# Patient Record
Sex: Female | Born: 1970 | Race: Black or African American | Hispanic: No | Smoking: Never smoker
Health system: Southern US, Community
[De-identification: ages and names within clinical notes are randomized; demographics above are authoritative.]

## PROBLEM LIST (undated history)

## (undated) ENCOUNTER — Inpatient Hospital Stay (HOSPITAL_COMMUNITY): Payer: Self-pay

## (undated) DIAGNOSIS — J45909 Unspecified asthma, uncomplicated: Secondary | ICD-10-CM

## (undated) DIAGNOSIS — B009 Herpesviral infection, unspecified: Secondary | ICD-10-CM

## (undated) DIAGNOSIS — O3410 Maternal care for benign tumor of corpus uteri, unspecified trimester: Secondary | ICD-10-CM

## (undated) DIAGNOSIS — E669 Obesity, unspecified: Secondary | ICD-10-CM

## (undated) DIAGNOSIS — Z973 Presence of spectacles and contact lenses: Secondary | ICD-10-CM

## (undated) DIAGNOSIS — Z8742 Personal history of other diseases of the female genital tract: Secondary | ICD-10-CM

## (undated) DIAGNOSIS — U071 COVID-19: Secondary | ICD-10-CM

## (undated) DIAGNOSIS — R519 Headache, unspecified: Secondary | ICD-10-CM

## (undated) DIAGNOSIS — R011 Cardiac murmur, unspecified: Secondary | ICD-10-CM

## (undated) DIAGNOSIS — N840 Polyp of corpus uteri: Secondary | ICD-10-CM

## (undated) DIAGNOSIS — D259 Leiomyoma of uterus, unspecified: Secondary | ICD-10-CM

## (undated) DIAGNOSIS — Z201 Contact with and (suspected) exposure to tuberculosis: Secondary | ICD-10-CM

## (undated) DIAGNOSIS — K219 Gastro-esophageal reflux disease without esophagitis: Secondary | ICD-10-CM

## (undated) DIAGNOSIS — Z87898 Personal history of other specified conditions: Secondary | ICD-10-CM

## (undated) DIAGNOSIS — E119 Type 2 diabetes mellitus without complications: Secondary | ICD-10-CM

## (undated) DIAGNOSIS — IMO0002 Reserved for concepts with insufficient information to code with codable children: Secondary | ICD-10-CM

## (undated) DIAGNOSIS — I10 Essential (primary) hypertension: Secondary | ICD-10-CM

## (undated) DIAGNOSIS — F329 Major depressive disorder, single episode, unspecified: Secondary | ICD-10-CM

## (undated) DIAGNOSIS — F419 Anxiety disorder, unspecified: Secondary | ICD-10-CM

## (undated) DIAGNOSIS — Z789 Other specified health status: Secondary | ICD-10-CM

## (undated) DIAGNOSIS — D509 Iron deficiency anemia, unspecified: Secondary | ICD-10-CM

## (undated) DIAGNOSIS — A159 Respiratory tuberculosis unspecified: Secondary | ICD-10-CM

## (undated) DIAGNOSIS — R7303 Prediabetes: Secondary | ICD-10-CM

## (undated) HISTORY — PX: REFRACTIVE SURGERY: SHX103

## (undated) HISTORY — DX: Type 2 diabetes mellitus without complications: E11.9

## (undated) HISTORY — DX: Essential (primary) hypertension: I10

## (undated) HISTORY — PX: COMBINED ABDOMINOPLASTY AND LIPOSUCTION: SUR284

## (undated) HISTORY — DX: Obesity, unspecified: E66.9

## (undated) HISTORY — PX: RETINAL DETACHMENT SURGERY: SHX105

---
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
## 1993-08-19 HISTORY — PX: CERVICAL BIOPSY  W/ LOOP ELECTRODE EXCISION: SUR135

## 1994-05-30 HISTORY — PX: COLPOCLEISIS VAGINAL LE FORT: SUR279

## 1994-11-18 HISTORY — PX: LEEP: SHX91

## 1995-08-20 HISTORY — PX: WISDOM TOOTH EXTRACTION: SHX21

## 1997-11-15 ENCOUNTER — Ambulatory Visit (HOSPITAL_COMMUNITY): Admission: RE | Admit: 1997-11-15 | Discharge: 1997-11-15 | Payer: Self-pay | Admitting: *Deleted

## 1998-06-09 ENCOUNTER — Encounter: Admission: RE | Admit: 1998-06-09 | Discharge: 1998-09-07 | Payer: Self-pay | Admitting: *Deleted

## 1998-06-09 ENCOUNTER — Inpatient Hospital Stay (HOSPITAL_COMMUNITY): Admission: AD | Admit: 1998-06-09 | Discharge: 1998-06-09 | Payer: Self-pay | Admitting: *Deleted

## 1998-06-10 ENCOUNTER — Inpatient Hospital Stay (HOSPITAL_COMMUNITY): Admission: AD | Admit: 1998-06-10 | Discharge: 1998-06-10 | Payer: Self-pay | Admitting: *Deleted

## 1998-06-22 ENCOUNTER — Inpatient Hospital Stay (HOSPITAL_COMMUNITY): Admission: AD | Admit: 1998-06-22 | Discharge: 1998-06-22 | Payer: Self-pay | Admitting: *Deleted

## 1998-06-23 ENCOUNTER — Encounter: Payer: Self-pay | Admitting: Obstetrics and Gynecology

## 1998-06-23 ENCOUNTER — Observation Stay (HOSPITAL_COMMUNITY): Admission: AD | Admit: 1998-06-23 | Discharge: 1998-06-24 | Payer: Self-pay | Admitting: Obstetrics and Gynecology

## 1998-06-24 ENCOUNTER — Encounter: Payer: Self-pay | Admitting: Obstetrics and Gynecology

## 1998-06-26 ENCOUNTER — Inpatient Hospital Stay (HOSPITAL_COMMUNITY): Admission: AD | Admit: 1998-06-26 | Discharge: 1998-06-28 | Payer: Self-pay | Admitting: Obstetrics and Gynecology

## 1998-08-09 ENCOUNTER — Inpatient Hospital Stay (HOSPITAL_COMMUNITY): Admission: AD | Admit: 1998-08-09 | Discharge: 1998-08-12 | Payer: Self-pay | Admitting: *Deleted

## 1998-08-14 ENCOUNTER — Encounter (HOSPITAL_COMMUNITY): Admission: RE | Admit: 1998-08-14 | Discharge: 1998-11-12 | Payer: Self-pay | Admitting: *Deleted

## 1999-05-05 ENCOUNTER — Other Ambulatory Visit: Admission: RE | Admit: 1999-05-05 | Discharge: 1999-05-05 | Payer: Self-pay | Admitting: *Deleted

## 1999-05-05 ENCOUNTER — Encounter (INDEPENDENT_AMBULATORY_CARE_PROVIDER_SITE_OTHER): Payer: Self-pay

## 1999-06-28 ENCOUNTER — Encounter (INDEPENDENT_AMBULATORY_CARE_PROVIDER_SITE_OTHER): Payer: Self-pay

## 1999-06-28 ENCOUNTER — Ambulatory Visit (HOSPITAL_COMMUNITY): Admission: RE | Admit: 1999-06-28 | Discharge: 1999-06-28 | Payer: Self-pay | Admitting: *Deleted

## 1999-06-28 HISTORY — PX: HYSTEROSCOPY: SHX211

## 1999-06-28 HISTORY — PX: DILATION AND CURETTAGE OF UTERUS: SHX78

## 1999-06-28 HISTORY — PX: COMBINED HYSTEROSCOPY DIAGNOSTIC / D&C: SUR297

## 2002-01-14 ENCOUNTER — Other Ambulatory Visit: Admission: RE | Admit: 2002-01-14 | Discharge: 2002-01-14 | Payer: Self-pay | Admitting: Obstetrics & Gynecology

## 2003-01-18 ENCOUNTER — Other Ambulatory Visit: Admission: RE | Admit: 2003-01-18 | Discharge: 2003-01-18 | Payer: Self-pay | Admitting: Obstetrics & Gynecology

## 2004-01-25 ENCOUNTER — Other Ambulatory Visit: Admission: RE | Admit: 2004-01-25 | Discharge: 2004-01-25 | Payer: Self-pay | Admitting: Obstetrics & Gynecology

## 2006-01-04 ENCOUNTER — Emergency Department (HOSPITAL_COMMUNITY): Admission: EM | Admit: 2006-01-04 | Discharge: 2006-01-04 | Payer: Self-pay | Admitting: Emergency Medicine

## 2006-02-07 ENCOUNTER — Encounter: Admission: RE | Admit: 2006-02-07 | Discharge: 2006-02-07 | Payer: Self-pay | Admitting: Internal Medicine

## 2007-08-27 ENCOUNTER — Encounter: Admission: RE | Admit: 2007-08-27 | Discharge: 2007-08-27 | Payer: Self-pay | Admitting: Internal Medicine

## 2007-11-23 ENCOUNTER — Ambulatory Visit: Payer: Self-pay | Admitting: Internal Medicine

## 2009-01-13 ENCOUNTER — Inpatient Hospital Stay (HOSPITAL_COMMUNITY): Admission: AD | Admit: 2009-01-13 | Discharge: 2009-01-13 | Payer: Self-pay | Admitting: Obstetrics & Gynecology

## 2009-01-13 ENCOUNTER — Ambulatory Visit: Payer: Self-pay | Admitting: Obstetrics and Gynecology

## 2009-03-20 ENCOUNTER — Inpatient Hospital Stay (HOSPITAL_COMMUNITY): Admission: AD | Admit: 2009-03-20 | Discharge: 2009-03-20 | Payer: Self-pay | Admitting: Obstetrics & Gynecology

## 2009-03-20 ENCOUNTER — Ambulatory Visit: Payer: Self-pay | Admitting: Advanced Practice Midwife

## 2009-03-21 ENCOUNTER — Encounter (INDEPENDENT_AMBULATORY_CARE_PROVIDER_SITE_OTHER): Payer: Self-pay | Admitting: Obstetrics and Gynecology

## 2009-03-21 ENCOUNTER — Inpatient Hospital Stay (HOSPITAL_COMMUNITY): Admission: AD | Admit: 2009-03-21 | Discharge: 2009-03-23 | Payer: Self-pay | Admitting: Obstetrics & Gynecology

## 2009-03-24 ENCOUNTER — Encounter: Admission: RE | Admit: 2009-03-24 | Discharge: 2009-04-23 | Payer: Self-pay | Admitting: Obstetrics & Gynecology

## 2009-04-24 ENCOUNTER — Encounter: Admission: RE | Admit: 2009-04-24 | Discharge: 2009-05-23 | Payer: Self-pay | Admitting: Obstetrics & Gynecology

## 2009-05-24 ENCOUNTER — Encounter: Admission: RE | Admit: 2009-05-24 | Discharge: 2009-06-23 | Payer: Self-pay | Admitting: Obstetrics & Gynecology

## 2009-06-24 ENCOUNTER — Encounter: Admission: RE | Admit: 2009-06-24 | Discharge: 2009-07-23 | Payer: Self-pay | Admitting: Obstetrics & Gynecology

## 2009-07-24 ENCOUNTER — Encounter: Admission: RE | Admit: 2009-07-24 | Discharge: 2009-08-17 | Payer: Self-pay | Admitting: Obstetrics & Gynecology

## 2009-08-24 ENCOUNTER — Encounter: Admission: RE | Admit: 2009-08-24 | Discharge: 2009-09-23 | Payer: Self-pay | Admitting: Obstetrics & Gynecology

## 2009-09-24 ENCOUNTER — Encounter: Admission: RE | Admit: 2009-09-24 | Discharge: 2009-10-06 | Payer: Self-pay | Admitting: Obstetrics & Gynecology

## 2010-01-31 IMAGING — US US FETAL BPP W/O NONSTRESS
1 series · 14 of 16 positions shown · non-contrast
Comparison: none

OBSTETRICAL ULTRASOUND:
 This ultrasound exam was performed in the [HOSPITAL] Ultrasound Department.  The OB US report was generated in the AS system, and faxed to the ordering physician.  This report is also available in [REDACTED] PACS.

[Series 1: us fetal bpp w/o nonstress · non-contrast · 0.29mm/px · 16 acquisitions, 14 frames shown]
[im 1/16]
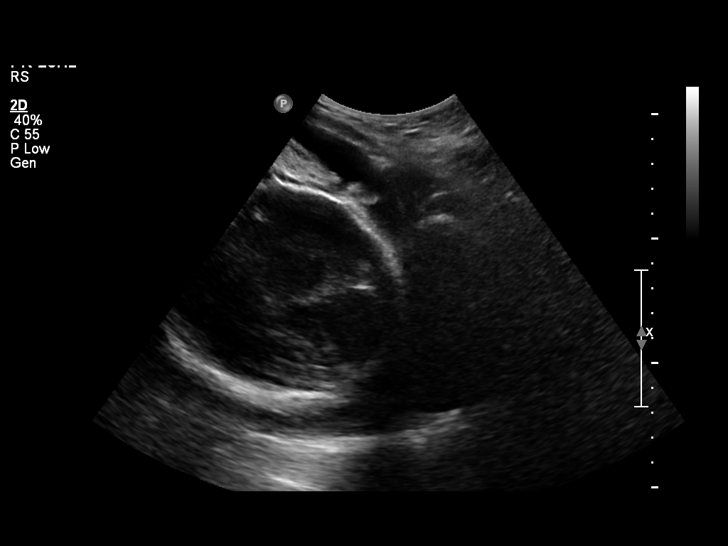
[im 2/16]
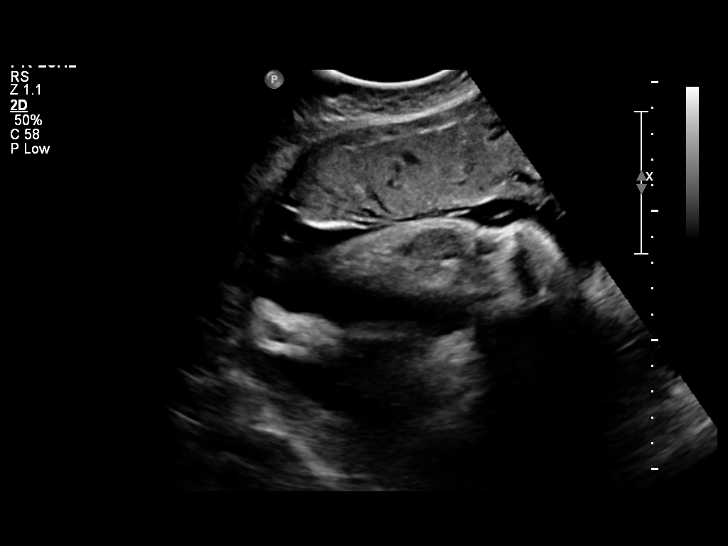
[im 3/16]
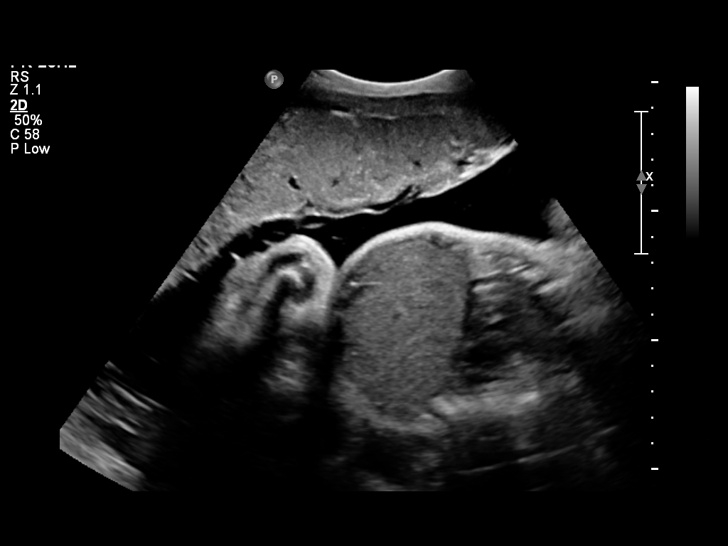
[im 5/16]
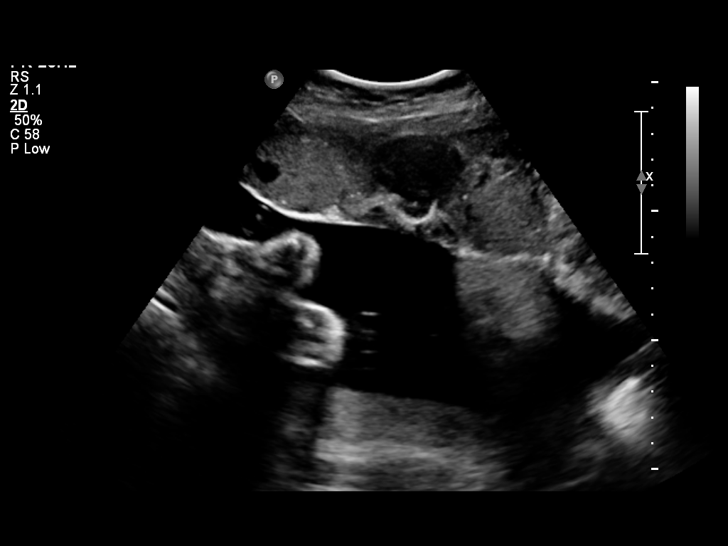
[im 6/16]
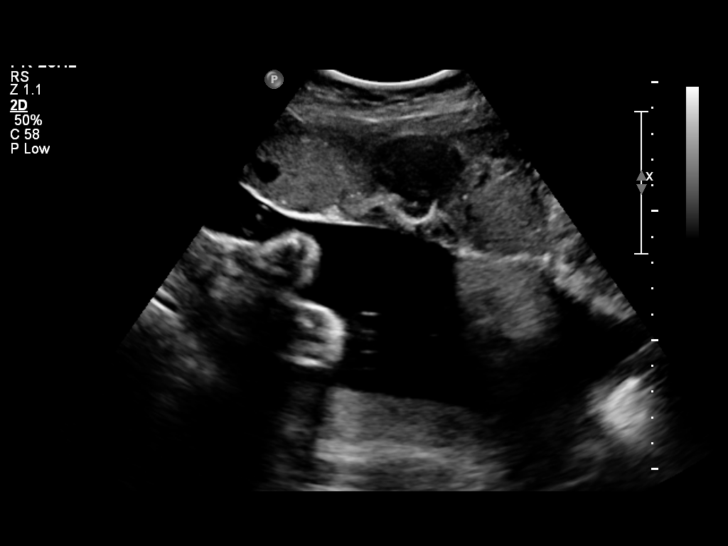
[im 7/16]
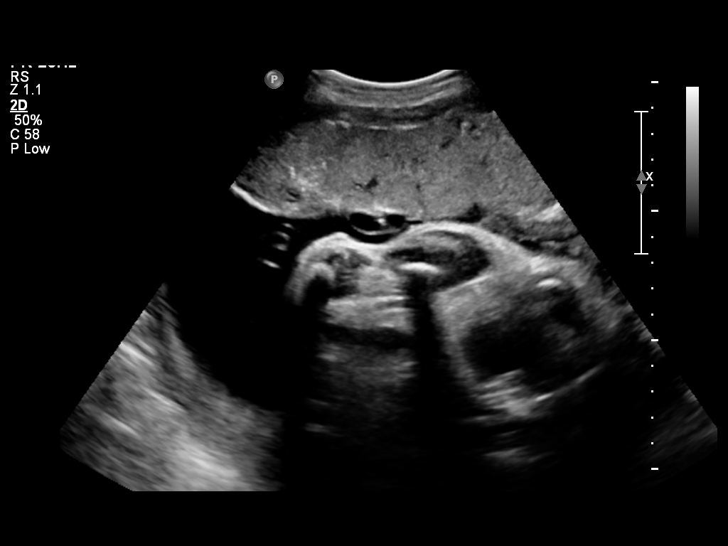
[im 8/16]
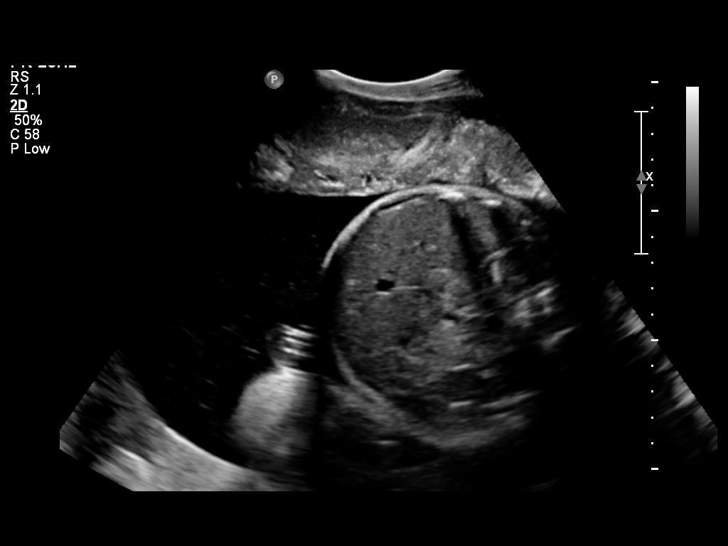
[im 9/16]
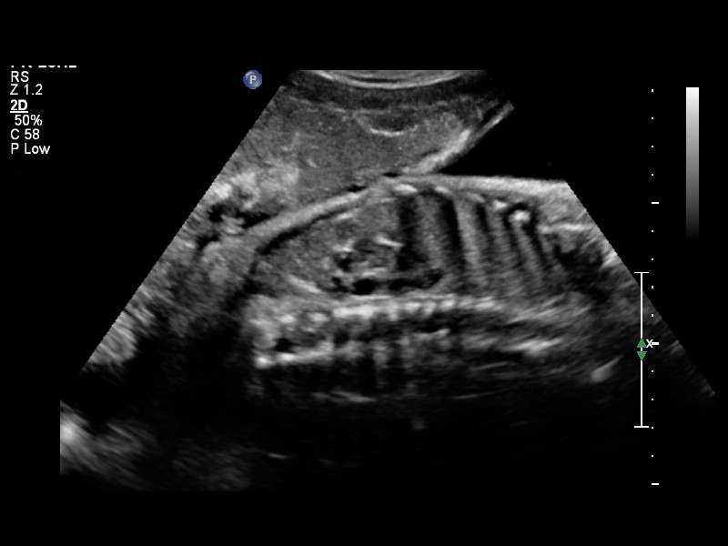
[im 10/16]
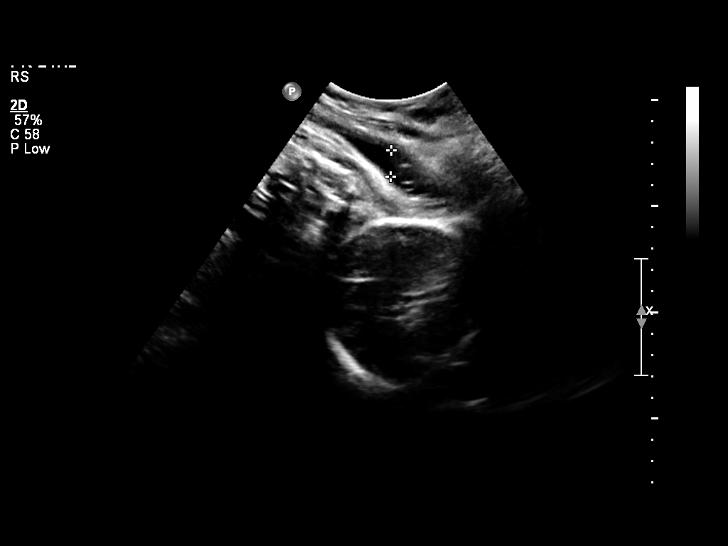
[im 11/16]
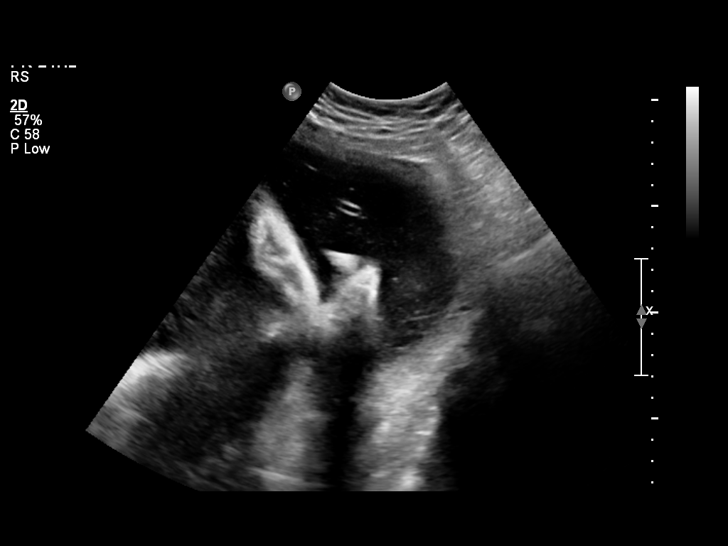
[im 13/16]
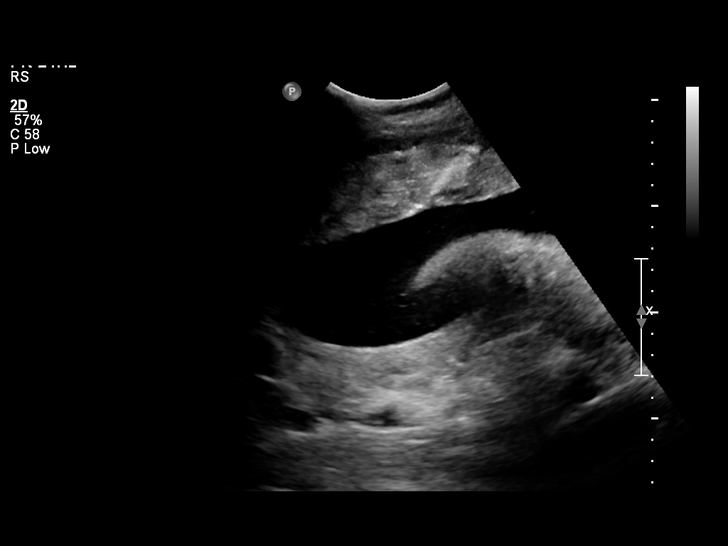
[im 14/16]
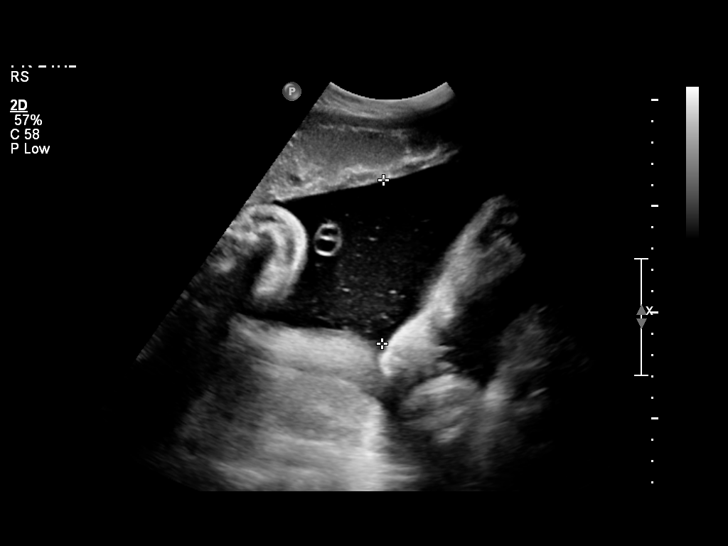
[im 15/16]
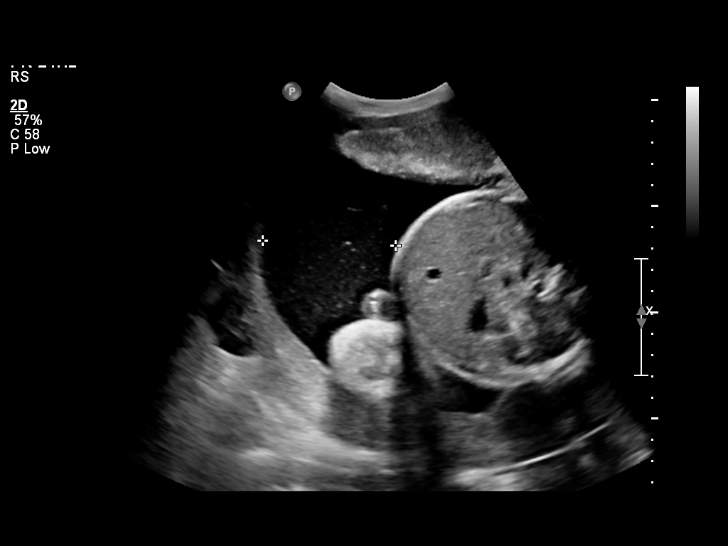
[im 16/16]
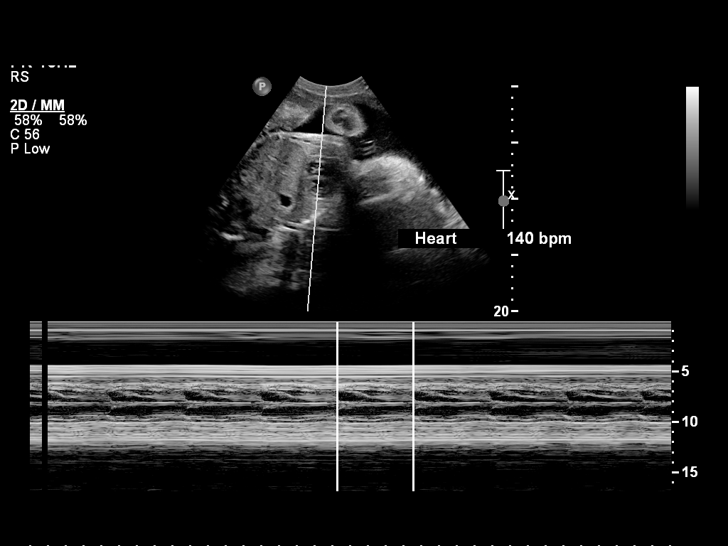

[14 of 16 positions shown; findings below may reference images not displayed]

IMPRESSION: See AS Obstetric US report.

## 2010-11-24 LAB — COMPREHENSIVE METABOLIC PANEL
AST: 33 U/L (ref 0–37)
Albumin: 2.7 g/dL — ABNORMAL LOW (ref 3.5–5.2)
Alkaline Phosphatase: 232 U/L — ABNORMAL HIGH (ref 39–117)
Alkaline Phosphatase: 242 U/L — ABNORMAL HIGH (ref 39–117)
BUN: 4 mg/dL — ABNORMAL LOW (ref 6–23)
BUN: 4 mg/dL — ABNORMAL LOW (ref 6–23)
CO2: 21 mEq/L (ref 19–32)
Chloride: 106 mEq/L (ref 96–112)
Chloride: 107 mEq/L (ref 96–112)
Creatinine, Ser: 0.38 mg/dL — ABNORMAL LOW (ref 0.4–1.2)
Creatinine, Ser: 0.5 mg/dL (ref 0.4–1.2)
GFR calc Af Amer: >60 mL/min (ref >60)
GFR calc non Af Amer: >60 mL/min (ref >60)
Glucose, Bld: 81 mg/dL (ref 70–99)
Potassium: 3.8 mEq/L (ref 3.5–5.1)
Potassium: 3.9 mEq/L (ref 3.5–5.1)
Total Bilirubin: 0.3 mg/dL (ref 0.3–1.2)
Total Bilirubin: 0.4 mg/dL (ref 0.3–1.2)
Total Protein: 5.9 g/dL — ABNORMAL LOW (ref 6.0–8.3)

## 2010-11-24 LAB — CBC
HCT: 33.7 % — ABNORMAL LOW (ref 36.0–46.0)
HCT: 39 % (ref 36.0–46.0)
Hemoglobin: 11.1 g/dL — ABNORMAL LOW (ref 12.0–15.0)
Hemoglobin: 12.9 g/dL (ref 12.0–15.0)
MCV: 87 fL (ref 78.0–100.0)
MCV: 89.2 fL (ref 78.0–100.0)
Platelets: 128 10*3/uL — ABNORMAL LOW (ref 150–400)
Platelets: 151 10*3/uL (ref 150–400)
RBC: 4.49 MIL/uL (ref 3.87–5.11)
RBC: 4.77 MIL/uL (ref 3.87–5.11)
WBC: 11.5 10*3/uL — ABNORMAL HIGH (ref 4.0–10.5)
WBC: 11.8 10*3/uL — ABNORMAL HIGH (ref 4.0–10.5)
WBC: 9.4 10*3/uL (ref 4.0–10.5)

## 2010-11-24 LAB — URIC ACID: Uric Acid, Serum: 4.6 mg/dL (ref 2.4–7.0)

## 2010-11-24 LAB — URINALYSIS, ROUTINE W REFLEX MICROSCOPIC
Glucose, UA: NEGATIVE mg/dL
Hgb urine dipstick: NEGATIVE
Protein, ur: NEGATIVE mg/dL

## 2010-11-24 LAB — RPR: RPR Ser Ql: NONREACTIVE

## 2011-01-01 NOTE — Assessment & Plan Note (Signed)
[facility]                         GASTROENTEROLOGY OFFICE NOTE   Alexandra Hale, Alexandra Hale                        MRN:          [id number]  DATE:11/23/2007                            DOB:          1970/12/02    Alexandra Hale is a very nice 40 year old patient of Dr. Seymour Bars who is  referred here because of abdominal pain. Dr. Seymour Bars has sent [place] her  office note from September 15, 2007, describing the patient complaining of  abdominal pain which started in the left flank, radiated along the left  upper quadrant, throbbing sometimes and achy and tingling in sensation.  It was associated with constipation. Dr. Seymour Bars felt that she needs to  be further evaluated first for constipation and if the pain persists in  spite of treatment of the constipation she would proceed with diagnostic  laparoscopy to rule out possible endometriosis. Alexandra Hale has waited  for appointment to see me today and in the meantime all her symptoms  have subsided. She no longer has any constipation, and she no longer has  any abdominal pain. She has been well now for several weeks. The  constipation improved after she started drinking more liquids and taking  more fiber in her diet. There has been no fever, rectal bleeding. She  has a positive family history of diverticulosis and colon polyps in her  mother.   MEDICATIONS:  Tri-Cyclen 0.025 mg every day.   PAST MEDICAL HISTORY:  Gestational diabetes in 1999.   FAMILY HISTORY:  Positive for colon polyps and heart disease in mother.  Diabetes in father.   SOCIAL HISTORY:  Married with one child. 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 She does not smoke and does not drink alcohol.   REVIEW OF SYSTEMS:  Positive for eyeglasses, night sweats, pain with  periods.   PHYSICAL EXAMINATION:  VITAL SIGNS:  Blood pressure 122/70, pulse 68,  and weight 171 pounds.  GENERAL:  She was alert, oriented, in no distress.  SKIN:  Warm and  dry.  HEENT:  Sclerae anicteric.  NECK:  Supple. No lymphadenopathy.  LUNGS:  Clear to auscultation.  CHEST:  Exam of the rib cage did not detect any point of tenderness;  specifically no tenderness in the costochondral junctions.  ABDOMEN:  Soft, nondistended with normoactive bowel sounds. I could not  elicit any tenderness in the left or right upper quadrant or any of the  lower quadrants. Left __________  was unremarkable. No fullness. No CVA  tenderness.  RECTAL:  Normal perianal  tone was normal. There was a small amount of  Hemoccult negative stool in the __________ .   IMPRESSION:  A 40 year old African American female with episode of  abdominal pain and constipation which has resolved. She has no history  of chronic constipation or abdominal pain. This was an isolated episode  which lasted about 4 weeks. She is Hemoccult negative, and there are no  risk factors to prompt diagnostic colonoscopy at this time. At age of  37, I would suggest for her to have a colonoscopic exam or before if  her  symptoms recur.   PLAN:  1. I have given patient a well high-fiber diet as well as booklet on      constipation.  2. Colonoscopy at the age of 39.  3. We will see her again on a p.r.n. basis.     Alexandra Hale. Juanda Chance, MD  Electronically Signed    DMB/MedQ  DD: 11/23/2007  DT: 11/23/2007  Job #: [id number]   cc:   Genia Del, M.D.

## 2011-01-04 NOTE — Op Note (Signed)
[facility]  Patient:    Alexandra Hale                     MRN: [id number] Proc. Date: 06/28/99 Adm. Date:  [id number] Attending:  Ardeen Fillers                           Operative Report  PREOPERATIVE DIAGNOSIS:       Abnormal uterine bleeding, endometrial polyp on office biopsy.  POSTOPERATIVE DIAGNOSIS:      Abnormal uterine bleeding, endometrial polyp on office biopsy.  OPERATION:                    Operative hysteroscopy, D&C.  SURGEON:                      Sung Amabile. Roslyn Smiling, M.D.  ASSISTANT:  ANESTHESIA:                   General anesthesia with LMA.  ESTIMATED BLOOD LOSS:         50 cc.  TUBES AND DRAINS:             None.  COMPLICATIONS:                None.  FINDINGS:                     Anteverted normal size uterus, sounded to 9 cm. Polypoid appearing area in the endometrial cavity, resected.  No adnexal masses. Endometrial cavity otherwise normal.  SPECIMENS:                    Endometrial biopsies and curettings to pathology.   INDICATIONS:                  A 40 year old woman, gravida 1, para 1, receiving  oral contraceptives, with irregular bleeding.  Endometrial biopsy in the office  revealed a fragment of endometrial polyp and the patient is admitted now for operative hysteroscopy.  DESCRIPTION OF PROCEDURE:     After the establishment of general anesthesia, the patient was placed in the dorsal lithotomy position.  The perineum and vagina were prepped with Betadine solution.  The bladder was evacuated with straight catheterization.  The patient was draped.  Examination under anesthesia for the  above findings was carried out.  Graves speculum was inserted in the vagina.  The cervix was reprepped with Betadine solution.  The anterior cervical lip was grasped with a single tooth tenaculum.  The uterus was sounded to 9 cm.  Pratt dilators were used to serially dilate the endocervical canal to a #33 [place].   Operative hysteroscope was introduced into the endometrial cavity without difficulty.  Sorbitol was the distending medium at a  pressure of 80 mmHg.  Photographs were taken.  The polypoid area was resected with a 90 degree single loupe on settings of 70 cutting and 70 coagulation.  The biopsies were removed.  Gentle sharp curettage was performed.  Instruments were  removed and hemostasis was noted.  The patient was returned to the supine position, extubated without difficulty, and transported to the recovery room in satisfactory condition. DD:  06/28/99 TD:  06/28/99 Job: [id number]

## 2011-05-31 LAB — OB RESULTS CONSOLE ANTIBODY SCREEN: Antibody Screen: NEGATIVE

## 2011-05-31 LAB — OB RESULTS CONSOLE HEPATITIS B SURFACE ANTIGEN: Hepatitis B Surface Ag: NEGATIVE

## 2011-06-18 LAB — OB RESULTS CONSOLE GC/CHLAMYDIA
Chlamydia: NEGATIVE
Gonorrhea: NEGATIVE
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
## 2011-09-27 ENCOUNTER — Encounter (HOSPITAL_COMMUNITY): Payer: Self-pay | Admitting: *Deleted

## 2011-09-27 ENCOUNTER — Inpatient Hospital Stay (HOSPITAL_COMMUNITY)
Admission: AD | Admit: 2011-09-27 | Discharge: 2011-09-27 | Disposition: A | Discharge status: Home / Self Care | Payer: BC Managed Care – PPO | Source: Ambulatory Visit | Attending: Obstetrics & Gynecology | Admitting: Obstetrics & Gynecology

## 2011-09-27 DIAGNOSIS — Y92410 Unspecified street and highway as the place of occurrence of the external cause: Secondary | ICD-10-CM | POA: Insufficient documentation

## 2011-09-27 DIAGNOSIS — O99891 Other specified diseases and conditions complicating pregnancy: Secondary | ICD-10-CM | POA: Insufficient documentation

## 2011-09-27 HISTORY — DX: Reserved for concepts with insufficient information to code with codable children: IMO0002
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
## 2011-12-04 LAB — OB RESULTS CONSOLE GBS: GBS: POSITIVE

## 2012-01-04 ENCOUNTER — Encounter (HOSPITAL_COMMUNITY): Payer: Self-pay | Admitting: Anesthesiology

## 2012-01-04 ENCOUNTER — Inpatient Hospital Stay (HOSPITAL_COMMUNITY): Payer: BC Managed Care – PPO | Admitting: Anesthesiology

## 2012-01-04 ENCOUNTER — Inpatient Hospital Stay (HOSPITAL_COMMUNITY)
Admission: AD | Admit: 2012-01-04 | Discharge: 2012-01-06 | DRG: 373 | Disposition: A | Discharge status: Home / Self Care | Payer: BC Managed Care – PPO | Source: Ambulatory Visit | Attending: Obstetrics | Admitting: Obstetrics

## 2012-01-04 ENCOUNTER — Encounter (HOSPITAL_COMMUNITY): Payer: Self-pay | Admitting: *Deleted

## 2012-01-04 DIAGNOSIS — O99892 Other specified diseases and conditions complicating childbirth: Secondary | ICD-10-CM | POA: Diagnosis present

## 2012-01-04 DIAGNOSIS — O09529 Supervision of elderly multigravida, unspecified trimester: Secondary | ICD-10-CM | POA: Diagnosis present

## 2012-01-04 DIAGNOSIS — Z23 Encounter for immunization: Secondary | ICD-10-CM

## 2012-01-04 DIAGNOSIS — IMO0001 Reserved for inherently not codable concepts without codable children: Secondary | ICD-10-CM

## 2012-01-04 DIAGNOSIS — Z22330 Carrier of Group B streptococcus: Secondary | ICD-10-CM

## 2012-01-04 HISTORY — DX: Leiomyoma of uterus, unspecified: D25.9

## 2012-01-04 HISTORY — DX: Herpesviral infection, unspecified: B00.9

## 2012-01-04 HISTORY — DX: Maternal care for benign tumor of corpus uteri, unspecified trimester: O34.10

## 2012-01-04 HISTORY — DX: Respiratory tuberculosis unspecified: A15.9

## 2012-01-04 HISTORY — DX: Other specified health status: Z78.9

## 2012-01-04 LAB — CBC
Hemoglobin: 11.9 g/dL — ABNORMAL LOW (ref 12.0–15.0)
MCH: 29 pg (ref 26.0–34.0)
MCV: 86.4 fL (ref 78.0–100.0)
RBC: 4.11 MIL/uL (ref 3.87–5.11)
WBC: 10 10*3/uL (ref 4.0–10.5)

## 2012-01-04 MED ORDER — FENTANYL 2.5 MCG/ML BUPIVACAINE 1/10 % EPIDURAL INFUSION (WH - ANES)
INTRAMUSCULAR | Status: DC | PRN
  Administered 2012-01-04: 14 mL/h via EPIDURAL

## 2012-01-04 MED ORDER — OXYTOCIN BOLUS FROM INFUSION
500.0000 mL | Freq: Once | INTRAVENOUS | Status: DC
  Filled 2012-01-04: qty 500
  Filled 2012-01-04: qty 1000

## 2012-01-04 MED ORDER — ZOLPIDEM TARTRATE 5 MG PO TABS: 5.0000 mg | ORAL_TABLET | Freq: Every evening | ORAL | Status: DC | PRN

## 2012-01-04 MED ORDER — FLEET ENEMA 7-19 GM/118ML RE ENEM: 1.0000 | ENEMA | RECTAL | Status: DC | PRN

## 2012-01-04 MED ORDER — IBUPROFEN 600 MG PO TABS
600.0000 mg | ORAL_TABLET | Freq: Four times a day (QID) | ORAL | Status: DC
  Administered 2012-01-04 – 2012-01-06 (×7): 600 mg via ORAL
  Filled 2012-01-04 (×7): qty 1

## 2012-01-04 MED ORDER — LACTATED RINGERS IV SOLN
500.0000 mL | INTRAVENOUS | Status: DC | PRN
  Administered 2012-01-04: 1000 mL via INTRAVENOUS

## 2012-01-04 MED ORDER — PHENYLEPHRINE 40 MCG/ML (10ML) SYRINGE FOR IV PUSH (FOR BLOOD PRESSURE SUPPORT): 80.0000 ug | PREFILLED_SYRINGE | INTRAVENOUS | Status: DC | PRN

## 2012-01-04 MED ORDER — OXYTOCIN 20 UNITS IN LACTATED RINGERS INFUSION - SIMPLE
125.0000 mL/h | Freq: Once | INTRAVENOUS | Status: AC
  Administered 2012-01-04: 999 mL/h via INTRAVENOUS

## 2012-01-04 MED ORDER — BUTORPHANOL TARTRATE 2 MG/ML IJ SOLN: 1.0000 mg | INTRAMUSCULAR | Status: DC | PRN

## 2012-01-04 MED ORDER — IBUPROFEN 600 MG PO TABS: 600.0000 mg | ORAL_TABLET | Freq: Four times a day (QID) | ORAL | Status: DC | PRN

## 2012-01-04 MED ORDER — FLEET ENEMA 7-19 GM/118ML RE ENEM: 1.0000 | ENEMA | Freq: Every day | RECTAL | Status: DC | PRN

## 2012-01-04 MED ORDER — OXYCODONE-ACETAMINOPHEN 5-325 MG PO TABS: 1.0000 | ORAL_TABLET | ORAL | Status: DC | PRN

## 2012-01-04 MED ORDER — LACTATED RINGERS IV SOLN: 500.0000 mL | Freq: Once | INTRAVENOUS | Status: DC

## 2012-01-04 MED ORDER — DIPHENHYDRAMINE HCL 50 MG/ML IJ SOLN: 12.5000 mg | INTRAMUSCULAR | Status: DC | PRN

## 2012-01-04 MED ORDER — SODIUM BICARBONATE 8.4 % IV SOLN
INTRAVENOUS | Status: DC | PRN
  Administered 2012-01-04: 4 mL via EPIDURAL

## 2012-01-04 MED ORDER — PHENYLEPHRINE 40 MCG/ML (10ML) SYRINGE FOR IV PUSH (FOR BLOOD PRESSURE SUPPORT)
80.0000 ug | PREFILLED_SYRINGE | INTRAVENOUS | Status: DC | PRN
  Filled 2012-01-04: qty 5

## 2012-01-04 MED ORDER — SENNOSIDES-DOCUSATE SODIUM 8.6-50 MG PO TABS: 2.0000 | ORAL_TABLET | Freq: Every day | ORAL | Status: DC

## 2012-01-04 MED ORDER — WITCH HAZEL-GLYCERIN EX PADS: 1.0000 "application " | MEDICATED_PAD | CUTANEOUS | Status: DC | PRN

## 2012-01-04 MED ORDER — LACTATED RINGERS IV SOLN
INTRAVENOUS | Status: DC
  Administered 2012-01-04: 19:00:00 via INTRAVENOUS

## 2012-01-04 MED ORDER — EPHEDRINE 5 MG/ML INJ
10.0000 mg | INTRAVENOUS | Status: DC | PRN
  Filled 2012-01-04: qty 4

## 2012-01-04 MED ORDER — FENTANYL 2.5 MCG/ML BUPIVACAINE 1/10 % EPIDURAL INFUSION (WH - ANES)
14.0000 mL/h | INTRAMUSCULAR | Status: DC
  Filled 2012-01-04: qty 60

## 2012-01-04 MED ORDER — ACETAMINOPHEN 325 MG PO TABS: 650.0000 mg | ORAL_TABLET | ORAL | Status: DC | PRN

## 2012-01-04 MED ORDER — LIDOCAINE HCL (PF) 1 % IJ SOLN
30.0000 mL | INTRAMUSCULAR | Status: DC | PRN
  Filled 2012-01-04: qty 30

## 2012-01-04 MED ORDER — PRENATAL MULTIVITAMIN CH
1.0000 | ORAL_TABLET | Freq: Every day | ORAL | Status: DC
  Administered 2012-01-05 – 2012-01-06 (×2): 1 via ORAL
  Filled 2012-01-04 (×2): qty 1

## 2012-01-04 MED ORDER — SIMETHICONE 80 MG PO CHEW: 80.0000 mg | CHEWABLE_TABLET | ORAL | Status: DC | PRN

## 2012-01-04 MED ORDER — DIPHENHYDRAMINE HCL 25 MG PO CAPS: 25.0000 mg | ORAL_CAPSULE | Freq: Four times a day (QID) | ORAL | Status: DC | PRN

## 2012-01-04 MED ORDER — ONDANSETRON HCL 4 MG/2ML IJ SOLN: 4.0000 mg | INTRAMUSCULAR | Status: DC | PRN

## 2012-01-04 MED ORDER — LANOLIN HYDROUS EX OINT: TOPICAL_OINTMENT | CUTANEOUS | Status: DC | PRN

## 2012-01-04 MED ORDER — ONDANSETRON HCL 4 MG PO TABS: 4.0000 mg | ORAL_TABLET | ORAL | Status: DC | PRN

## 2012-01-04 MED ORDER — ONDANSETRON HCL 4 MG/2ML IJ SOLN: 4.0000 mg | Freq: Four times a day (QID) | INTRAMUSCULAR | Status: DC | PRN

## 2012-01-04 MED ORDER — CITRIC ACID-SODIUM CITRATE 334-500 MG/5ML PO SOLN: 30.0000 mL | ORAL | Status: DC | PRN

## 2012-01-04 MED ORDER — DIBUCAINE 1 % RE OINT: 1.0000 "application " | TOPICAL_OINTMENT | RECTAL | Status: DC | PRN

## 2012-01-04 MED ORDER — AMPICILLIN SODIUM 2 G IJ SOLR
2.0000 g | Freq: Once | INTRAMUSCULAR | Status: AC
  Administered 2012-01-04: 2 g via INTRAVENOUS
  Filled 2012-01-04: qty 2000

## 2012-01-04 MED ORDER — BISACODYL 10 MG RE SUPP: 10.0000 mg | Freq: Every day | RECTAL | Status: DC | PRN

## 2012-01-04 MED ORDER — EPHEDRINE 5 MG/ML INJ: 10.0000 mg | INTRAVENOUS | Status: DC | PRN

## 2012-01-04 MED ORDER — OXYTOCIN 10 UNIT/ML IJ SOLN: 10.0000 [IU] | Freq: Once | INTRAMUSCULAR | Status: DC

## 2012-01-04 MED ORDER — BENZOCAINE-MENTHOL 20-0.5 % EX AERO
1.0000 "application " | INHALATION_SPRAY | CUTANEOUS | Status: DC | PRN
  Administered 2012-01-04: 1 via TOPICAL
  Filled 2012-01-04: qty 56

## 2012-01-04 MED ORDER — TETANUS-DIPHTH-ACELL PERTUSSIS 5-2.5-18.5 LF-MCG/0.5 IM SUSP
0.5000 mL | Freq: Once | INTRAMUSCULAR | Status: AC
  Administered 2012-01-05: 0.5 mL via INTRAMUSCULAR
  Filled 2012-01-04: qty 0.5
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
## 2012-01-04 NOTE — Progress Notes (Signed)
SVD of viable female 

## 2012-01-04 NOTE — MAU Note (Signed)
Pt states, " I started having contractions at 3 pm, and now they are about seven minutes apart. My last baby was born in one hour after I got here."

## 2012-01-04 NOTE — Progress Notes (Signed)
In and out cathed for 300 cc yellow urine. Tolerated without difficulty

## 2012-01-04 NOTE — Progress Notes (Signed)
Attempted to void on bedpan x 5 minutes. Warm water to perineum. Unable to void.

## 2012-01-04 NOTE — Progress Notes (Signed)
Alexandra Hale is a 41 y.o. G3P2002 at [redacted]w[redacted]d by ultrasound admitted for active labor  Subjective: Comfortable after epidural, + rectal pressure.  Objective: Filed Vitals:   01/04/12 1926 01/04/12 1931 01/04/12 1936 01/04/12 1941  BP: 139/70 147/77 123/71 130/73  Pulse: 83 83 86 87  Temp:  97.9 F (36.6 C)    TempSrc:  Oral    Resp:  20 20 20   Height:      Weight:      SpO2:              FHT:  FHR: 140 bpm, variability: moderate,  accelerations:  Present,  decelerations:  Present occasional variables to 90, one time to 70, resolved w/ repositioning UC:  irregular, every 1-4 minutes SVE:   Dilation: 10 Effacement (%): 100 Station: +1 Exam by:: Renae Fickle, CNM  Labs:   Basename 01/04/12 1728  WBC 10.0  HGB 11.9*  HCT 35.5*  PLT 159    Assessment / Plan: Spontaneous labor, progressing normally  Labor: Rapid progress, AROM of forebag (small trickle of cl AF mixed w/ bloody show noted immediatly prior to AROM), suspect OP presentation, will attempt pushing in lateral position to encourage rotation Preeclampsia:  na Fetal Wellbeing:  Category I and Category II Pain Control:  Epidural I/D:  Ampicillin 2 gm given Anticipated MOD:  NSVD  Mavin Dyke 01/04/2012, 8:12 PM

## 2012-01-04 NOTE — Anesthesia Preprocedure Evaluation (Signed)

## 2012-01-04 NOTE — Progress Notes (Signed)
Assisted pt. Up with steady to wheelchair. Transferred to room 120 via wheelchair with family at side. Assisted pt, from wheelchair to bed with steady. Tolerated without difficulty.

## 2012-01-04 NOTE — Anesthesia Procedure Notes (Signed)

## 2012-01-05 ENCOUNTER — Encounter (HOSPITAL_COMMUNITY): Payer: Self-pay

## 2012-01-05 LAB — CBC
Hemoglobin: 11.5 g/dL — ABNORMAL LOW (ref 12.0–15.0)
MCH: 28.7 pg (ref 26.0–34.0)
MCV: 86.8 fL (ref 78.0–100.0)
RBC: 4.01 MIL/uL (ref 3.87–5.11)
WBC: 14.4 10*3/uL — ABNORMAL HIGH (ref 4.0–10.5)

## 2012-01-05 LAB — RPR: RPR Ser Ql: NONREACTIVE

## 2012-01-05 NOTE — Anesthesia Postprocedure Evaluation (Signed)
  Anesthesia Post-op Note  Patient: Alexandra Hale  This patient has recovered from her labor epidural, and I am not aware of any complications or problems.

## 2012-01-05 NOTE — Progress Notes (Signed)
PPD 1 SVD  S:  Reports feeling well.             Tolerating po/ No nausea or vomiting             Bleeding is moderate             Pain controlled with Motrin, some cramping.             Up ad lib / ambulatory / voiding well.   Newborn  Information for the patient's newborn:  Kyrstyn, Greear [[id number]]  female  breast feeding  / Circumcision desired   O:  A & O x 3 NAD             VS:  Filed Vitals:   01/04/12 2146 01/04/12 2245 01/05/12 0015 01/05/12 0400  BP: 137/78 138/76 131/67 133/75  Pulse: 95 84 84 82  Temp:  98.5 F (36.9 C) 98.1 F (36.7 C) 88.5 F (31.4 C)  TempSrc:  Oral Oral Oral  Resp: 18 20 20 20   Height:      Weight:      SpO2:        LABS:  Basename 01/05/12 0531 01/04/12 1728  WBC 14.4* 10.0  HGB 11.5* 11.9*  HCT 34.8* 35.5*  PLT 146* 159    Blood type: --/--/O POS (05/18 1728)  Rubella:     I&O: I/O last 3 completed shifts: In: -  Out: 300 [Blood:300]      Lungs: Clear and unlabored  Heart: regular rate and rhythm / no murmurs  Abdomen: soft, non-tender, non-distended              Fundus: firm, non-tender, @U   Perineum: repair intact, no edema  Lochia: moderate  Extremities: no edema, no calf pain or tenderness, neg Homans    A/P: PPD # 1 40 y.o., [id number]    Principal Problem:  *PP care - s/p NVB 5/18   Doing well - stable status  Routine post partum orders  Anticipate discharge home in AM.   Kathern Lobosco, CNM, MSN 01/05/2012, 9:58 AM

## 2012-01-06 MED ORDER — IBUPROFEN 600 MG PO TABS: 600.0000 mg | ORAL_TABLET | Freq: Four times a day (QID) | ORAL | Status: AC

## 2012-01-06 NOTE — Discharge Summary (Signed)
Obstetric Discharge Summary Reason for Admission: onset of labor Prenatal Procedures: ultrasound Intrapartum Procedures: spontaneous vaginal delivery Postpartum Procedures: TDaP vaccine Complications-Operative and Postpartum: vaginal degree vaginal laceration Hemoglobin  Date Value Range Status  01/05/2012 11.5* 12.0-15.0 (g/dL) Final     HCT  Date Value Range Status  01/05/2012 34.8* 36.0-46.0 (%) Final    Physical Exam:  General: alert, cooperative and no distress Lochia: appropriate Uterine Fundus: firm Incision: healing well DVT Evaluation: Negative Homan's sign. No significant calf/ankle edema.  Discharge Diagnoses: Term Pregnancy-delivered  Discharge Information: Date: 01/06/2012 Activity: pelvic rest Diet: routine Medications: PNV and Ibuprofen Condition: stable Instructions: refer to practice specific booklet Discharge to: home Follow-up Information    Follow up with LAVOIE,MARIE-LYNE, MD. Schedule an appointment as soon as possible for a visit in 6 weeks.   Contact information:   [address] [place] [address] [phone]          Newborn Data: Live born female "Rory Percy"  Birth Weight: 7 lb 4.9 oz (3315 g) APGAR: 9, 9  Home with mother.  Benedetto Ryder 01/06/2012, 9:43 AM

## 2012-01-06 NOTE — Progress Notes (Signed)
Post Partum Day #2            Information for the patient's newborn:  Ellyson, Rarick [[id number]]  female   / circumcision pending this am. Feeding: breast  Subjective: No HA, SOB, CP, F/C, breast symptoms. Pain minimal. Normal vaginal bleeding, no clots.      Objective:  Temp:  [97.6 F (36.4 C)-98.6 F (37 C)] 97.6 F (36.4 C) (05/20 0617) Pulse Rate:  [73-81] 73  (05/20 0617) Resp:  [18-20] 18  (05/20 0617) BP: (102-117)/(66-74) 102/66 mmHg (05/20 0617)  No intake or output data in the 24 hours ending 01/06/12 0941     Basename 01/05/12 0531 01/04/12 1728  WBC 14.4* 10.0  HGB 11.5* 11.9*  HCT 34.8* 35.5*  PLT 146* 159    Blood type: --/--/O POS (05/18 1728) Rubella: Immune (10/18 0000)    Physical Exam:  General: alert, cooperative and no distress Uterine Fundus: firm Lochia: appropriate Perineum: repair intact, edema none DVT Evaluation: Negative Homan's sign. No significant calf/ankle edema.    Assessment/Plan: PPD # 2 / 41 y.o., [id number] S/P:spontaneous vaginal   Principal Problem:  *PP care - s/p NVB 5/18    normal postpartum exam  Continue current postpartum care  D/C home   LOS: 2 days   Jeovanny Cuadros, CNM, MSN 01/06/2012, 9:41 AM

## 2014-01-11 ENCOUNTER — Encounter: Payer: BC Managed Care – PPO | Attending: Internal Medicine

## 2014-01-11 VITALS — Ht 63.0 in | Wt 168.2 lb

## 2014-01-11 DIAGNOSIS — E119 Type 2 diabetes mellitus without complications: Secondary | ICD-10-CM

## 2014-01-11 DIAGNOSIS — Z713 Dietary counseling and surveillance: Secondary | ICD-10-CM | POA: Insufficient documentation

## 2014-01-18 ENCOUNTER — Encounter: Payer: BC Managed Care – PPO | Attending: Internal Medicine

## 2014-01-18 DIAGNOSIS — E119 Type 2 diabetes mellitus without complications: Secondary | ICD-10-CM | POA: Insufficient documentation

## 2014-01-18 DIAGNOSIS — Z713 Dietary counseling and surveillance: Secondary | ICD-10-CM | POA: Insufficient documentation

## 2014-01-18 NOTE — Progress Notes (Signed)
Patient was seen on 01/11/14 for the first of a series of three diabetes self-management courses at the [facility]. Glucometer dispensed, instruction provided with return demonstration. Patient to contact PCP for order for testing supplies.  Current HbA1c: 6.6%  The following learning objectives were met by the patient during this class:  Describe diabetes  State some common risk factors for diabetes  Defines the role of glucose and insulin  Identifies type of diabetes and pathophysiology  Describe the relationship between diabetes and cardiovascular risk  State the members of the Healthcare Team  States the rationale for glucose monitoring  State when to test glucose  State their individual Target Range  State the importance of logging glucose readings  Describe how to interpret glucose readings  Identifies A1C target  Explain the correlation between A1c and eAG values  State symptoms and treatment of high blood glucose  State symptoms and treatment of low blood glucose  Explain proper technique for glucose testing  Identifies proper sharps disposal  Handouts given during class include:  Living Well with Diabetes book  Carb Counting and Meal Planning book  Meal Plan Card  Carbohydrate guide  Meal planning worksheet  Low Sodium Flavoring Tips  The diabetes portion plate  Y0K to eAG Conversion Chart  Diabetes Medications  Diabetes Recommended Care Schedule  Support Group  Diabetes Success Plan  Core Class Satisfaction Survey  Follow-Up Plan:  Attend core 2

## 2014-01-19 NOTE — Progress Notes (Signed)

## 2014-01-25 DIAGNOSIS — E119 Type 2 diabetes mellitus without complications: Secondary | ICD-10-CM

## 2014-01-25 NOTE — Progress Notes (Signed)
Patient was seen on 01/25/2014 for the third of a series of three diabetes self-management courses at the [facility]. The following learning objectives were met by the patient during this class:    State the amount of activity recommended for healthy living   Describe activities suitable for individual needs   Identify ways to regularly incorporate activity into daily life   Identify barriers to activity and ways to over come these barriers  Identify diabetes medications being personally used and their primary action for lowering glucose and possible side effects   Describe role of stress on blood glucose and develop strategies to address psychosocial issues   Identify diabetes complications and ways to prevent them  Explain how to manage diabetes during illness   Evaluate success in meeting personal goal   Establish 2-3 goals that they will plan to diligently work on until they return for the  17-monthfollow-up visit  Goals:  Follow Diabetes Meal Plan as instructed  Aim for 15-30 mins of physical activity daily as tolerated  Bring food record and glucose log to your follow up visit  Your patient has established the following 4 month goals in their individualized success plan: I will count my carb choices at most meals and snacks @ 2-3 per meal for weight loss I will continue my activity level at least 50 minutes or more, 3 days a week To help manage stress I will do exercise at least 3 times a week  Your patient has identified these potential barriers to change:  stress  Your patient has identified their diabetes self-care support plan as  [facility]  Family support  Plan:  Attend Core 4 in 4 months

## 2014-05-05 ENCOUNTER — Encounter (HOSPITAL_COMMUNITY): Payer: Self-pay | Admitting: Pharmacist

## 2014-05-06 ENCOUNTER — Other Ambulatory Visit: Payer: Self-pay | Admitting: Obstetrics & Gynecology

## 2014-05-16 NOTE — Patient Instructions (Signed)
Your procedure is scheduled on:  Friday, May 20, 2014  Enter through the [employer] [facility] at:  8:00 a.m.  Pick up the phone at the desk and dial [phone].  Call this number if you have problems the morning of surgery: [phone].  Remember: Do NOT eat food:  AFTER MIDNIGHT Thursday Do NOT drink clear liquids after:  AFTER MIDNIGHT Thursday Take these medicines the morning of surgery with a SIP OF WATER:  Hydrochlorothiazide *Stop taking all vitamins now  Do NOT wear jewelry (body piercing), metal hair clips/bobby pins, make-up, or nail polish. Do NOT wear lotions, powders, or perfumes.  You may wear deoderant. Do NOT shave for 48 hours prior to surgery. Do NOT bring valuables to the hospital. Contacts, dentures, or bridgework may not be worn into surgery. Have a responsible adult drive you home and stay with you for 24 hours after your procedure

## 2014-05-17 ENCOUNTER — Ambulatory Visit (HOSPITAL_COMMUNITY)
Admission: RE | Admit: 2014-05-17 | Discharge: 2014-05-20 | Disposition: A | Discharge status: Home / Self Care | Payer: BC Managed Care – PPO | Source: Ambulatory Visit | Attending: Obstetrics & Gynecology | Admitting: Obstetrics & Gynecology

## 2014-05-17 ENCOUNTER — Encounter (HOSPITAL_COMMUNITY): Payer: Self-pay

## 2014-05-17 ENCOUNTER — Encounter (HOSPITAL_COMMUNITY)
Admission: RE | Admit: 2014-05-17 | Discharge: 2014-05-17 | Disposition: A | Discharge status: Home / Self Care | Payer: BC Managed Care – PPO | Source: Ambulatory Visit | Attending: Obstetrics & Gynecology | Admitting: Obstetrics & Gynecology

## 2014-05-17 DIAGNOSIS — R011 Cardiac murmur, unspecified: Secondary | ICD-10-CM | POA: Diagnosis not present

## 2014-05-17 DIAGNOSIS — Z302 Encounter for sterilization: Secondary | ICD-10-CM | POA: Insufficient documentation

## 2014-05-17 DIAGNOSIS — Z6828 Body mass index (BMI) 28.0-28.9, adult: Secondary | ICD-10-CM | POA: Diagnosis not present

## 2014-05-17 DIAGNOSIS — K219 Gastro-esophageal reflux disease without esophagitis: Secondary | ICD-10-CM | POA: Insufficient documentation

## 2014-05-17 DIAGNOSIS — E119 Type 2 diabetes mellitus without complications: Secondary | ICD-10-CM | POA: Diagnosis not present

## 2014-05-17 DIAGNOSIS — E669 Obesity, unspecified: Secondary | ICD-10-CM | POA: Insufficient documentation

## 2014-05-17 DIAGNOSIS — I10 Essential (primary) hypertension: Secondary | ICD-10-CM | POA: Insufficient documentation

## 2014-05-17 DIAGNOSIS — B009 Herpesviral infection, unspecified: Secondary | ICD-10-CM | POA: Insufficient documentation

## 2014-05-17 HISTORY — DX: Gastro-esophageal reflux disease without esophagitis: K21.9

## 2014-05-17 HISTORY — DX: Cardiac murmur, unspecified: R01.1

## 2014-05-17 LAB — CBC
HCT: 42.1 % (ref 36.0–46.0)
Hemoglobin: 13.8 g/dL (ref 12.0–15.0)
MCH: 27.7 pg (ref 26.0–34.0)
MCHC: 32.8 g/dL (ref 30.0–36.0)
MCV: 84.4 fL (ref 78.0–100.0)
PLATELETS: 232 10*3/uL (ref 150–400)
RBC: 4.99 MIL/uL (ref 3.87–5.11)
RDW: 13.9 % (ref 11.5–15.5)
WBC: 8.2 10*3/uL (ref 4.0–10.5)

## 2014-05-17 LAB — BASIC METABOLIC PANEL
ANION GAP: 12 (ref 5–15)
BUN: 13 mg/dL (ref 6–23)
CHLORIDE: 99 meq/L (ref 96–112)
CO2: 29 mEq/L (ref 19–32)
CREATININE: 0.62 mg/dL (ref 0.50–1.10)
Calcium: 9.5 mg/dL (ref 8.4–10.5)
GFR calc non Af Amer: >90 mL/min (ref >90)
Glucose, Bld: 97 mg/dL (ref 70–99)
Potassium: 3.8 mEq/L (ref 3.7–5.3)
SODIUM: 140 meq/L (ref 137–147)

## 2014-05-17 NOTE — Pre-Procedure Instructions (Signed)
Dr. Rudean Curt made aware of patient's EKG findings. No new orders received at this time.

## 2014-05-19 DIAGNOSIS — Z6828 Body mass index (BMI) 28.0-28.9, adult: Secondary | ICD-10-CM | POA: Diagnosis not present

## 2014-05-19 DIAGNOSIS — Z302 Encounter for sterilization: Secondary | ICD-10-CM | POA: Diagnosis present

## 2014-05-19 DIAGNOSIS — K219 Gastro-esophageal reflux disease without esophagitis: Secondary | ICD-10-CM | POA: Diagnosis not present

## 2014-05-19 DIAGNOSIS — R011 Cardiac murmur, unspecified: Secondary | ICD-10-CM | POA: Diagnosis not present

## 2014-05-19 DIAGNOSIS — E119 Type 2 diabetes mellitus without complications: Secondary | ICD-10-CM | POA: Diagnosis not present

## 2014-05-19 DIAGNOSIS — B009 Herpesviral infection, unspecified: Secondary | ICD-10-CM | POA: Diagnosis not present

## 2014-05-19 DIAGNOSIS — E669 Obesity, unspecified: Secondary | ICD-10-CM | POA: Diagnosis not present

## 2014-05-19 DIAGNOSIS — I10 Essential (primary) hypertension: Secondary | ICD-10-CM | POA: Diagnosis not present

## 2014-05-20 ENCOUNTER — Encounter (HOSPITAL_COMMUNITY): Payer: Self-pay

## 2014-05-20 ENCOUNTER — Ambulatory Visit (HOSPITAL_COMMUNITY): Payer: BC Managed Care – PPO | Admitting: Anesthesiology

## 2014-05-20 ENCOUNTER — Encounter (HOSPITAL_COMMUNITY)
Admission: RE | Disposition: A | Discharge status: Home / Self Care | Payer: Self-pay | Source: Ambulatory Visit | Attending: Obstetrics & Gynecology

## 2014-05-20 ENCOUNTER — Encounter (HOSPITAL_COMMUNITY): Payer: BC Managed Care – PPO | Admitting: Anesthesiology

## 2014-05-20 DIAGNOSIS — Z302 Encounter for sterilization: Secondary | ICD-10-CM | POA: Diagnosis not present

## 2014-05-20 HISTORY — PX: LAPAROSCOPIC TUBAL LIGATION: SHX1937

## 2014-05-20 LAB — GLUCOSE, CAPILLARY
Glucose-Capillary: 94 mg/dL (ref 70–99)
Glucose-Capillary: 90 mg/dL (ref 70–99)

## 2014-05-20 LAB — PREGNANCY, URINE: Preg Test, Ur: NEGATIVE

## 2014-05-20 SURGERY — LIGATION, FALLOPIAN TUBE, LAPAROSCOPIC
Anesthesia: General | Site: Abdomen | Laterality: Bilateral

## 2014-05-20 MED ORDER — LIDOCAINE HCL (CARDIAC) 20 MG/ML IV SOLN
INTRAVENOUS | Status: AC
  Filled 2014-05-20: qty 5

## 2014-05-20 MED ORDER — PROPOFOL 10 MG/ML IV BOLUS
INTRAVENOUS | Status: DC | PRN
  Administered 2014-05-20: 200 mg via INTRAVENOUS

## 2014-05-20 MED ORDER — BUPIVACAINE HCL (PF) 0.25 % IJ SOLN
INTRAMUSCULAR | Status: AC
  Filled 2014-05-20: qty 10

## 2014-05-20 MED ORDER — BUPIVACAINE HCL (PF) 0.25 % IJ SOLN
INTRAMUSCULAR | Status: DC | PRN
  Administered 2014-05-20: 10 mL

## 2014-05-20 MED ORDER — ACETAMINOPHEN 160 MG/5ML PO SOLN
ORAL | Status: AC
  Filled 2014-05-20: qty 40.6

## 2014-05-20 MED ORDER — MIDAZOLAM HCL 5 MG/5ML IJ SOLN
INTRAMUSCULAR | Status: DC | PRN
  Administered 2014-05-20: 2 mg via INTRAVENOUS

## 2014-05-20 MED ORDER — ONDANSETRON HCL 4 MG/2ML IJ SOLN
INTRAMUSCULAR | Status: AC
  Filled 2014-05-20: qty 2

## 2014-05-20 MED ORDER — ROCURONIUM BROMIDE 100 MG/10ML IV SOLN
INTRAVENOUS | Status: DC | PRN
  Administered 2014-05-20: 40 mg via INTRAVENOUS

## 2014-05-20 MED ORDER — MIDAZOLAM HCL 2 MG/2ML IJ SOLN
INTRAMUSCULAR | Status: AC
Start: 2014-05-20 — End: 2014-05-20
  Filled 2014-05-20: qty 2

## 2014-05-20 MED ORDER — FENTANYL CITRATE 0.05 MG/ML IJ SOLN
INTRAMUSCULAR | Status: AC
  Filled 2014-05-20: qty 5

## 2014-05-20 MED ORDER — FENTANYL CITRATE 0.05 MG/ML IJ SOLN
INTRAMUSCULAR | Status: DC | PRN
Start: 2014-05-20 — End: 2014-05-20
  Administered 2014-05-20: 50 ug via INTRAVENOUS
  Administered 2014-05-20 (×2): 100 ug via INTRAVENOUS

## 2014-05-20 MED ORDER — GLYCOPYRROLATE 0.2 MG/ML IJ SOLN
INTRAMUSCULAR | Status: DC | PRN
  Administered 2014-05-20: 0.6 mg via INTRAVENOUS

## 2014-05-20 MED ORDER — ROCURONIUM BROMIDE 100 MG/10ML IV SOLN
INTRAVENOUS | Status: AC
  Filled 2014-05-20: qty 1

## 2014-05-20 MED ORDER — ONDANSETRON HCL 4 MG/2ML IJ SOLN
INTRAMUSCULAR | Status: DC | PRN
  Administered 2014-05-20: 4 mg via INTRAVENOUS

## 2014-05-20 MED ORDER — CEFAZOLIN SODIUM-DEXTROSE 2-3 GM-% IV SOLR
2.0000 g | INTRAVENOUS | Status: AC
  Administered 2014-05-20: 2 g via INTRAVENOUS

## 2014-05-20 MED ORDER — CEFAZOLIN SODIUM-DEXTROSE 2-3 GM-% IV SOLR
INTRAVENOUS | Status: AC
  Filled 2014-05-20: qty 50

## 2014-05-20 MED ORDER — HYDROCODONE-IBUPROFEN 5-200 MG PO TABS: 1.0000 | ORAL_TABLET | Freq: Three times a day (TID) | ORAL | Status: DC | PRN

## 2014-05-20 MED ORDER — LIDOCAINE HCL (CARDIAC) 20 MG/ML IV SOLN
INTRAVENOUS | Status: DC | PRN
  Administered 2014-05-20: 50 mg via INTRAVENOUS

## 2014-05-20 MED ORDER — SCOPOLAMINE 1 MG/3DAYS TD PT72
1.0000 | MEDICATED_PATCH | Freq: Once | TRANSDERMAL | Status: DC
  Administered 2014-05-20: 1.5 mg via TRANSDERMAL

## 2014-05-20 MED ORDER — ACETAMINOPHEN 160 MG/5ML PO SOLN
960.0000 mg | Freq: Four times a day (QID) | ORAL | Status: DC | PRN
  Administered 2014-05-20: 960 mg via ORAL

## 2014-05-20 MED ORDER — LACTATED RINGERS IV SOLN
INTRAVENOUS | Status: DC
  Administered 2014-05-20 (×2): via INTRAVENOUS

## 2014-05-20 MED ORDER — KETOROLAC TROMETHAMINE 30 MG/ML IJ SOLN
INTRAMUSCULAR | Status: DC | PRN
  Administered 2014-05-20: 30 mg via INTRAVENOUS

## 2014-05-20 MED ORDER — PROPOFOL 10 MG/ML IV EMUL
INTRAVENOUS | Status: AC
  Filled 2014-05-20: qty 20

## 2014-05-20 MED ORDER — GLYCOPYRROLATE 0.2 MG/ML IJ SOLN
INTRAMUSCULAR | Status: AC
  Filled 2014-05-20: qty 3

## 2014-05-20 MED ORDER — SODIUM CHLORIDE 0.9 % IR SOLN
Status: DC | PRN
  Administered 2014-05-20: 1000 mL

## 2014-05-20 MED ORDER — KETOROLAC TROMETHAMINE 30 MG/ML IJ SOLN
INTRAMUSCULAR | Status: AC
Start: 2014-05-20 — End: 2014-05-20
  Filled 2014-05-20: qty 1

## 2014-05-20 MED ORDER — SCOPOLAMINE 1 MG/3DAYS TD PT72
MEDICATED_PATCH | TRANSDERMAL | Status: DC
Start: 2014-05-20 — End: 2014-05-20
  Administered 2014-05-20: 1.5 mg via TRANSDERMAL
  Filled 2014-05-20: qty 1

## 2014-05-20 MED ORDER — DEXAMETHASONE SODIUM PHOSPHATE 4 MG/ML IJ SOLN
INTRAMUSCULAR | Status: AC
  Filled 2014-05-20: qty 1

## 2014-05-20 MED ORDER — DEXAMETHASONE SODIUM PHOSPHATE 4 MG/ML IJ SOLN
INTRAMUSCULAR | Status: DC | PRN
  Administered 2014-05-20: 4 mg via INTRAVENOUS

## 2014-05-20 MED ORDER — NEOSTIGMINE METHYLSULFATE 10 MG/10ML IV SOLN
INTRAVENOUS | Status: DC | PRN
  Administered 2014-05-20: 4 mg via INTRAVENOUS

## 2014-05-20 MED ORDER — FENTANYL CITRATE 0.05 MG/ML IJ SOLN: 25.0000 ug | INTRAMUSCULAR | Status: DC | PRN

## 2014-05-20 SURGICAL SUPPLY — 26 items
ADH SKN CLS APL DERMABOND .7 (GAUZE/BANDAGES/DRESSINGS) ×1
APPLICATOR COTTON TIP 6IN STRL (MISCELLANEOUS) IMPLANT
CATH ROBINSON RED A/P 16FR (CATHETERS) ×2 IMPLANT
CLOTH BEACON ORANGE TIMEOUT ST (SAFETY) ×2 IMPLANT
DERMABOND ADVANCED (GAUZE/BANDAGES/DRESSINGS) ×1
DERMABOND ADVANCED .7 DNX12 (GAUZE/BANDAGES/DRESSINGS) IMPLANT
DRSG COVADERM PLUS 2X2 (GAUZE/BANDAGES/DRESSINGS) ×4 IMPLANT
DRSG OPSITE POSTOP 3X4 (GAUZE/BANDAGES/DRESSINGS) ×1 IMPLANT
ELECT REM PT RETURN 9FT ADLT (ELECTROSURGICAL) ×2
ELECTRODE REM PT RTRN 9FT ADLT (ELECTROSURGICAL) ×1 IMPLANT
GLOVE BIO SURGEON STRL SZ 6.5 (GLOVE) ×2 IMPLANT
GLOVE BIOGEL PI IND STRL 7.0 (GLOVE) ×2 IMPLANT
GLOVE BIOGEL PI INDICATOR 7.0 (GLOVE) ×3
GLOVE SURG SS PI 7.0 STRL IVOR (GLOVE) ×1 IMPLANT
GOWN STRL REUS W/TWL LRG LVL3 (GOWN DISPOSABLE) ×4 IMPLANT
PACK LAPAROSCOPY BASIN (CUSTOM PROCEDURE TRAY) ×2 IMPLANT
PAD OB MATERNITY 4.3X12.25 (PERSONAL CARE ITEMS) ×2 IMPLANT
PENCIL BUTTON HOLSTER BLD 10FT (ELECTRODE) ×2 IMPLANT
SUT MNCRL AB 4-0 PS2 18 (SUTURE) ×2 IMPLANT
SUT VIC AB 3-0 SH 27 (SUTURE) ×2
SUT VIC AB 3-0 SH 27X BRD (SUTURE) IMPLANT
SUT VICRYL 0 UR6 27IN ABS (SUTURE) ×2 IMPLANT
TOWEL OR 17X24 6PK STRL BLUE (TOWEL DISPOSABLE) ×4 IMPLANT
TROCAR BALLN 12MMX100 BLUNT (TROCAR) ×2 IMPLANT
WARMER LAPAROSCOPE (MISCELLANEOUS) ×2 IMPLANT
WATER STERILE IRR 1000ML POUR (IV SOLUTION) ×2 IMPLANT

## 2014-05-20 NOTE — H&P (Signed)
Alexandra Hale is an 43 y.o. female [phone]  RP:  Desire for Sterilization  Pertinent Gynecological History: Menses: flow is moderate Contraception: BCPs Blood transfusions: none Sexually transmitted diseases: no past history Last mammogram: neg Last pap: wnl OB History: G3P3003   Menstrual History:  Patient's last menstrual period was 05/15/2014.    Past Medical History  Diagnosis Date  . Abnormal Pap smear   . No pertinent past medical history   . Herpes   . 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 Uterine fibroids affecting pregnancy   . PP care - s/p NVB 5/18 01/05/2012  . Obesity   . Hypertension   . Heart murmur   . Diabetes mellitus without complication     diet controlled  . GERD (gastroesophageal reflux disease)     Past Surgical History  Procedure Laterality Date  . Cervical biopsy  w/ loop electrode excision  1995  . Wisdom tooth extraction  1997  . Dilation and curettage of uterus  06/28/99  . Hysteroscopy  06/28/99  . Leep  11/18/94  . Colpocleisis vaginal [place]  05/30/94  . Refractive surgery      Family History  Problem Relation Age of Onset  . Anesthesia problems Neg Hx   . Hypertension Mother   . Deep vein thrombosis Mother   . Cancer Father   . Heart disease Maternal Grandmother     Social History:  reports that she has never smoked. She has never used smokeless tobacco. She reports that she does not drink alcohol or use illicit drugs.  Allergies: No Known Allergies  Prescriptions prior to admission  Medication Sig Dispense Refill  . BIOTIN PO Take 1 each by mouth daily.      . Cholecalciferol (VITAMIN D) 400 UNIT/ML LIQD Take 10 drops by mouth daily.      . Cyanocobalamin (B-12 PO) Take 1 each by mouth daily.      . hydrochlorothiazide (HYDRODIURIL) 25 MG tablet Take 25 mg by mouth daily.      Marland Kitchen levonorgestrel-ethinyl estradiol (ENPRESSE,TRIVORA) tablet Take 1 tablet by mouth daily.        ROS  Blood pressure 118/78, pulse  67, temperature 98.2 F (36.8 C), temperature source Oral, resp. rate 18, last menstrual period 05/15/2014, SpO2 100.00%, unknown if currently breastfeeding. Physical Exam  Results for orders placed during the hospital encounter of 05/20/14 (from the past 24 hour(s))  PREGNANCY, URINE     Status: None   Collection Time    05/20/14  8:00 AM      Result Value Ref Range   Preg Test, Ur NEGATIVE  NEGATIVE  GLUCOSE, CAPILLARY     Status: None   Collection Time    05/20/14  8:15 AM      Result Value Ref Range   Glucose-Capillary 94  70 - 99 mg/dL    No results found.  Assessment/Plan: Desire for sterilization for LPS BT/S by cautherization.  Surgery and risks reviewed.  Devontae Casasola,MARIE-LYNE 05/20/2014, 9:30 AM

## 2014-05-20 NOTE — Discharge Summary (Signed)
  Physician Discharge Summary  Patient ID: Alexandra Hale MRN: [id number] DOB/AGE: 42-Oct-1972 42 y.o.  Admit date: 05/20/2014 Discharge date: 05/20/2014  Admission Diagnoses: Desires Sterilization  Discharge Diagnoses: Desires Sterilization        Active Problems:   * No active hospital problems. *   Discharged Condition: good  Hospital Course: Outpatient  Consults: None  Treatments: surgery: Laparoscopic bilateral tubal sterilization by cauterization  Disposition: 01-Home or Self Care     Medication List    STOP taking these medications       levonorgestrel-ethinyl estradiol tablet  Commonly known as:  ENPRESSE,TRIVORA      TAKE these medications       B-12 PO  Take 1 each by mouth daily.     BIOTIN PO  Take 1 each by mouth daily.     hydrochlorothiazide 25 MG tablet  Commonly known as:  HYDRODIURIL  Take 25 mg by mouth daily.     hydrocodone-ibuprofen 5-200 MG per tablet  Commonly known as:  VICOPROFEN  Take 1 tablet by mouth every 8 (eight) hours as needed for pain.     Vitamin D 400 UNIT/ML Liqd  Take 10 drops by mouth daily.           Follow-up Information   Follow up with Khali Perella,MARIE-LYNE, MD In 3 weeks.   Specialty:  Obstetrics and Gynecology   Contact information:   [place] [address] [phone]       Signed: Princess Bruins, MD 05/20/2014, 11:07 AM

## 2014-05-20 NOTE — Op Note (Signed)
05/20/2014  10:57 AM  PATIENT:  Alexandra Hale  43 y.o. female  PRE-OPERATIVE DIAGNOSIS:  Desires Sterilization  POST-OPERATIVE DIAGNOSIS:  Desires Sterilization  PROCEDURE:  Procedure(s): LAPAROSCOPIC BILATERAL TUBAL STERILIZATION BY CAUTERISATION  SURGEON:  Surgeon(s): Princess Bruins, MD  ASSISTANTS: none   ANESTHESIA:   general  PROCEDURE:  Under general anesthesia with endotracheal intubation.  The patient is prepped with ChloraPrep on the abdomen and with the Betadine on the suprapubic, vulvar and vaginal areas.  The bladder is catheterized.  The patient is draped as usual. The infraumbilical area is infiltrated with Marcaine one quarter plain 10 cc.  A 1.5 cm incision is done at that level with the scalpel. The aponeurosis is opened under direct vision with Mayo scissors.  The peritoneum is opened also with Mayo scissors.  A pursestring stitch of Vicryl 0 is done on the aponeurosis. The Sheryle Hail is inserted at that level and a pneumoperitoneum is created with CO2. The operative camera is inserted in that port.  We visualized the pelvic cavity, a fundal myoma is present. Both tubes are normal in size and appearance with good fimbriae. Both ovaries are normal in size and appearance.  Pictures were taken of the liver and the pelvic organs.  The Kleppinger was used to cauterize the right tube starting at about 2 cm from the cornua.  We cauterized at that level including the mesosalpinx and cauterized distal to that and then in the middle.  We proceeded the same way on the left side. Pictures were taken after cauterization. Hemostasis was adequate at all levels. The instrument was removed.  The Sheryle Hail was removed as well. The CO2 was evacuated.  The pursestring stitch was attached. A Vicryl 3-0 was used in a single stitch to close the adipose tissue. We then used a Vicryl 4-0 in a subcuticular stitch to close the skin. We added Dermabond and a honeycomb dressing.  The patient was brought to  recovery room in good and stable status.  ESTIMATED BLOOD LOSS: 20 CC   Intake/Output Summary (Last 24 hours) at 05/20/14 1057 Last data filed at 05/20/14 1027  Gross per 24 hour  Intake   1000 ml  Output    120 ml  Net    880 ml     BLOOD ADMINISTERED:none   LOCAL MEDICATIONS USED:  MARCAINE     SPECIMEN:  No Specimen  DISPOSITION OF SPECIMEN:  N/A  COUNTS:  YES  PLAN OF CARE: Transfer to PACU  Princess Bruins MD  05/20/2014  At 10:58 am

## 2014-05-20 NOTE — Transfer of Care (Signed)
Immediate Anesthesia Transfer of Care Note  Patient: Alexandra Hale  Procedure(s) Performed: Procedure(s): LAPAROSCOPIC TUBAL LIGATION  (Bilateral)  Patient Location: PACU  Anesthesia Type:General  Level of Consciousness: awake, alert  and oriented  Airway & Oxygen Therapy: Patient Spontanous Breathing and Patient connected to nasal cannula oxygen  Post-op Assessment: Report given to PACU RN  Post vital signs: Reviewed  Complications: No apparent anesthesia complications

## 2014-05-20 NOTE — Discharge Instructions (Addendum)
°  No Ibuprofen containing products (ie Advil, Aleve, Motrin, etc) until after 4:30 pm today.    Laparoscopic Tubal Ligation Care After Refer to this sheet in the next few weeks. These instructions provide you with information on caring for yourself after your procedure. Your caregiver may also give you more specific instructions. Your treatment has been planned according to current medical practices, but problems sometimes occur. Call your caregiver if you have any problems or questions after your procedure. HOME CARE INSTRUCTIONS   Rest the remainder of the day.  Only take over-the-counter or prescription medicines for pain, discomfort, or fever as directed by your caregiver. Do not take aspirin. It can cause bleeding.  Gradually resume daily activities, diet, rest, driving, and work.  Avoid sexual intercourse for 2 weeks or as directed.  Do not use tampons or douche.  Do not drive while taking pain medicine.  Do not lift anything over 5 pounds for 2 weeks or as directed.  Only take showers, not baths, until you are seen by your caregiver.  Change bandages (dressings) as directed.  Take your temperature twice a day and record it.  Try to have help for the first 7 to 10 days for your household needs.  Return to your caregiver to get your stitches (sutures) removed and for follow-up visits as directed. SEEK MEDICAL CARE IF:   You have redness, swelling, or increasing pain in a wound.  You have drainage from a wound lasting longer than 1 day.  Your pain is getting worse.  You have a rash.  You become dizzy or lightheaded.  You have a reaction to your medicine.  You need stronger medicine or a change in your pain medicine.  You notice a bad smell coming from a wound or dressing.  Your wound breaks open after the sutures have been removed.  You are constipated. SEEK IMMEDIATE MEDICAL CARE IF:   You faint.  You have a fever.  You have increasing abdominal  pain.  You have severe pain in your shoulders.  You have bleeding or drainage from the suture sites or vagina following surgery.  You have shortness of breath or difficulty breathing.  You have chest or leg pain.  You have persistent nausea, vomiting, or diarrhea. MAKE SURE YOU:   Understand these instructions.  Watch your condition.  Get help right away if you are not doing well or get worse. Document Released: 02/22/2005 Document Revised: 02/04/2012 Document Reviewed: 11/16/2011 [facility] Patient Information 2015 [place], [place]. This information is not intended to replace advice given to you by your health care provider. Make sure you discuss any questions you have with your health care provider.

## 2014-05-20 NOTE — Anesthesia Preprocedure Evaluation (Addendum)
Anesthesia Evaluation  Patient identified by MRN, date of birth, ID band Patient awake    Reviewed: Allergy & Precautions, H&P , Patient's Chart, lab work & pertinent test results, reviewed documented beta blocker date and time   Airway Mallampati: II TM Distance: >3 FB Neck ROM: full    Dental no notable dental hx.    Pulmonary  breath sounds clear to auscultation  Pulmonary exam normal       Cardiovascular hypertension, On Medications Rhythm:regular Rate:Normal     Neuro/Psych    GI/Hepatic   Endo/Other  diabetes  Renal/GU      Musculoskeletal   Abdominal   Peds  Hematology   Anesthesia Other Findings   Reproductive/Obstetrics                          Anesthesia Physical Anesthesia Plan  ASA: II  Anesthesia Plan: General   Post-op Pain Management:    Induction: Intravenous  Airway Management Planned: Oral ETT  Additional Equipment:   Intra-op Plan:   Post-operative Plan: Extubation in OR  Informed Consent: I have reviewed the patients History and Physical, chart, labs and discussed the procedure including the risks, benefits and alternatives for the proposed anesthesia with the patient or authorized representative who has indicated his/her understanding and acceptance.   Dental Advisory Given and Dental advisory given  Plan Discussed with: CRNA and Surgeon  Anesthesia Plan Comments: (  Discussed general anesthesia, including possible nausea, instrumentation of airway, sore throat,pulmonary aspiration, etc. I asked if the were any outstanding questions, or  concerns before we proceeded. )        Anesthesia Quick Evaluation

## 2014-05-23 ENCOUNTER — Encounter: Payer: BC Managed Care – PPO | Attending: Internal Medicine

## 2014-05-23 ENCOUNTER — Encounter (HOSPITAL_COMMUNITY): Payer: Self-pay | Admitting: Obstetrics & Gynecology

## 2014-05-23 DIAGNOSIS — Z713 Dietary counseling and surveillance: Secondary | ICD-10-CM | POA: Diagnosis not present

## 2014-05-23 DIAGNOSIS — E119 Type 2 diabetes mellitus without complications: Secondary | ICD-10-CM | POA: Diagnosis present

## 2014-05-23 NOTE — Progress Notes (Signed)
Patient was seen on 05/23/2014 for a review of the series of three diabetes self-management courses at the [facility]. The following learning objectives were met by the patient during this class:    Reviewed blood glucose monitoring and interpretation including the recommended target ranges and Hgb A1c.    Reviewed on carb counting, importance of regularly scheduled meals/snacks, and meal planning.    Reviewed the effects of physical activity on glucose levels and long-term glucose control.  Recommended goal of 150 minutes of physical activity/week.   Reviewed patient medications and discussed role of medication on blood glucose and possible side effects.   Discussed strategies to manage stress, psychosocial issues, and other obstacles to diabetes management.   Encouraged moderate weight reduction to improve glucose levels.     Reviewed short-term complications: hyper- and hypo-glycemia.  Discussed causes, symptoms, and treatment options.   Reviewed prevention, detection, and treatment of long-term complications.  Discussed the role of prolonged elevated glucose levels on body systems.  Goals:  Follow Diabetes Meal Plan as instructed  Eat 3 meals and 2 snacks, every 3-5 hrs  Limit carbohydrate intake to 45 grams carbohydrate/meal Limit carbohydrate intake to 15 grams carbohydrate/snack Add lean protein foods to meals/snacks  Monitor glucose levels as instructed by your doctor  Aim for goal of 15-30 mins of physical activity daily as tolerated  Bring food record and glucose log to your next nutrition visit

## 2014-05-27 NOTE — Anesthesia Postprocedure Evaluation (Signed)
  Anesthesia Post-op Note  Patient: Alexandra Hale  Procedure(s) Performed: Procedure(s): LAPAROSCOPIC TUBAL LIGATION  (Bilateral) Patient is awake and responsive. Pain and nausea are reasonably well controlled. Vital signs are stable and clinically acceptable. Oxygen saturation is clinically acceptable. There are no apparent anesthetic complications at this time. Patient is ready for discharge.

## 2014-06-20 ENCOUNTER — Encounter (HOSPITAL_COMMUNITY): Payer: Self-pay | Admitting: Obstetrics & Gynecology

## 2015-05-28 DIAGNOSIS — I10 Essential (primary) hypertension: Secondary | ICD-10-CM | POA: Insufficient documentation

## 2015-05-28 DIAGNOSIS — R7303 Prediabetes: Secondary | ICD-10-CM | POA: Insufficient documentation

## 2016-05-09 ENCOUNTER — Other Ambulatory Visit (HOSPITAL_BASED_OUTPATIENT_CLINIC_OR_DEPARTMENT_OTHER): Payer: Self-pay

## 2016-05-09 DIAGNOSIS — G478 Other sleep disorders: Secondary | ICD-10-CM

## 2016-06-02 ENCOUNTER — Ambulatory Visit (HOSPITAL_BASED_OUTPATIENT_CLINIC_OR_DEPARTMENT_OTHER): Payer: Self-pay

## 2016-09-18 DIAGNOSIS — L814 Other melanin hyperpigmentation: Secondary | ICD-10-CM | POA: Diagnosis not present

## 2016-09-18 DIAGNOSIS — Z713 Dietary counseling and surveillance: Secondary | ICD-10-CM | POA: Diagnosis not present

## 2016-09-23 DIAGNOSIS — R0683 Snoring: Secondary | ICD-10-CM | POA: Diagnosis not present

## 2016-09-23 DIAGNOSIS — G4753 Recurrent isolated sleep paralysis: Secondary | ICD-10-CM | POA: Diagnosis not present

## 2016-10-09 DIAGNOSIS — L814 Other melanin hyperpigmentation: Secondary | ICD-10-CM | POA: Diagnosis not present

## 2016-10-14 DIAGNOSIS — L814 Other melanin hyperpigmentation: Secondary | ICD-10-CM | POA: Diagnosis not present

## 2016-11-20 DIAGNOSIS — I10 Essential (primary) hypertension: Secondary | ICD-10-CM | POA: Diagnosis not present

## 2016-12-13 DIAGNOSIS — E1165 Type 2 diabetes mellitus with hyperglycemia: Secondary | ICD-10-CM | POA: Diagnosis not present

## 2016-12-13 DIAGNOSIS — E119 Type 2 diabetes mellitus without complications: Secondary | ICD-10-CM | POA: Diagnosis not present

## 2016-12-13 DIAGNOSIS — I10 Essential (primary) hypertension: Secondary | ICD-10-CM | POA: Diagnosis not present

## 2016-12-13 DIAGNOSIS — Z23 Encounter for immunization: Secondary | ICD-10-CM | POA: Diagnosis not present

## 2016-12-23 DIAGNOSIS — E119 Type 2 diabetes mellitus without complications: Secondary | ICD-10-CM | POA: Diagnosis not present

## 2016-12-23 DIAGNOSIS — J0100 Acute maxillary sinusitis, unspecified: Secondary | ICD-10-CM | POA: Diagnosis not present

## 2016-12-23 DIAGNOSIS — J029 Acute pharyngitis, unspecified: Secondary | ICD-10-CM | POA: Diagnosis not present

## 2017-01-03 DIAGNOSIS — Z1231 Encounter for screening mammogram for malignant neoplasm of breast: Secondary | ICD-10-CM | POA: Diagnosis not present

## 2017-01-03 DIAGNOSIS — Z01419 Encounter for gynecological examination (general) (routine) without abnormal findings: Secondary | ICD-10-CM | POA: Diagnosis not present

## 2017-01-16 DIAGNOSIS — I10 Essential (primary) hypertension: Secondary | ICD-10-CM | POA: Diagnosis not present

## 2017-02-06 DIAGNOSIS — Z630 Problems in relationship with spouse or partner: Secondary | ICD-10-CM | POA: Diagnosis not present

## 2017-02-27 DIAGNOSIS — Z630 Problems in relationship with spouse or partner: Secondary | ICD-10-CM | POA: Diagnosis not present

## 2017-03-06 DIAGNOSIS — Z630 Problems in relationship with spouse or partner: Secondary | ICD-10-CM | POA: Diagnosis not present

## 2017-03-13 DIAGNOSIS — Z630 Problems in relationship with spouse or partner: Secondary | ICD-10-CM | POA: Diagnosis not present

## 2017-03-15 DIAGNOSIS — S82402A Unspecified fracture of shaft of left fibula, initial encounter for closed fracture: Secondary | ICD-10-CM | POA: Diagnosis not present

## 2017-03-20 DIAGNOSIS — Z630 Problems in relationship with spouse or partner: Secondary | ICD-10-CM | POA: Diagnosis not present

## 2017-03-21 DIAGNOSIS — Z1159 Encounter for screening for other viral diseases: Secondary | ICD-10-CM | POA: Diagnosis not present

## 2017-03-21 DIAGNOSIS — Z118 Encounter for screening for other infectious and parasitic diseases: Secondary | ICD-10-CM | POA: Diagnosis not present

## 2017-03-24 ENCOUNTER — Ambulatory Visit (HOSPITAL_COMMUNITY)
Admission: RE | Admit: 2017-03-24 | Discharge: 2017-03-24 | Disposition: A | Discharge status: Home / Self Care | Payer: 59 | Source: Ambulatory Visit | Attending: Internal Medicine | Admitting: Internal Medicine

## 2017-03-24 ENCOUNTER — Other Ambulatory Visit (HOSPITAL_COMMUNITY): Payer: Self-pay | Admitting: Orthopedic Surgery

## 2017-03-24 DIAGNOSIS — M7989 Other specified soft tissue disorders: Secondary | ICD-10-CM | POA: Insufficient documentation

## 2017-03-24 DIAGNOSIS — M79605 Pain in left leg: Secondary | ICD-10-CM

## 2017-03-24 DIAGNOSIS — S92515D Nondisplaced fracture of proximal phalanx of left lesser toe(s), subsequent encounter for fracture with routine healing: Secondary | ICD-10-CM | POA: Diagnosis not present

## 2017-03-24 NOTE — Progress Notes (Signed)
*  PRELIMINARY RESULTS* Vascular Ultrasound left lower extremity venous duplex has been completed.  Preliminary findings: No evidence of deep vein thrombosis or baker's cysts in the left lower extremity.  Preliminary results called to [facility] at Dr. Rick Duff office @ 16:30   Alexandra Hale 03/24/2017, 4:28 PM

## 2017-03-25 DIAGNOSIS — T783XXA Angioneurotic edema, initial encounter: Secondary | ICD-10-CM | POA: Diagnosis not present

## 2017-03-25 DIAGNOSIS — R220 Localized swelling, mass and lump, head: Secondary | ICD-10-CM | POA: Diagnosis not present

## 2017-04-14 DIAGNOSIS — S92515D Nondisplaced fracture of proximal phalanx of left lesser toe(s), subsequent encounter for fracture with routine healing: Secondary | ICD-10-CM | POA: Diagnosis not present

## 2017-04-16 DIAGNOSIS — M25672 Stiffness of left ankle, not elsewhere classified: Secondary | ICD-10-CM | POA: Diagnosis not present

## 2017-04-16 DIAGNOSIS — M25572 Pain in left ankle and joints of left foot: Secondary | ICD-10-CM | POA: Diagnosis not present

## 2017-04-16 DIAGNOSIS — M6281 Muscle weakness (generalized): Secondary | ICD-10-CM | POA: Diagnosis not present

## 2017-04-17 DIAGNOSIS — Z630 Problems in relationship with spouse or partner: Secondary | ICD-10-CM | POA: Diagnosis not present

## 2017-04-22 DIAGNOSIS — M25672 Stiffness of left ankle, not elsewhere classified: Secondary | ICD-10-CM | POA: Diagnosis not present

## 2017-04-22 DIAGNOSIS — M6281 Muscle weakness (generalized): Secondary | ICD-10-CM | POA: Diagnosis not present

## 2017-04-22 DIAGNOSIS — M25572 Pain in left ankle and joints of left foot: Secondary | ICD-10-CM | POA: Diagnosis not present

## 2017-04-23 DIAGNOSIS — E119 Type 2 diabetes mellitus without complications: Secondary | ICD-10-CM | POA: Diagnosis not present

## 2017-04-23 DIAGNOSIS — T7840XD Allergy, unspecified, subsequent encounter: Secondary | ICD-10-CM | POA: Diagnosis not present

## 2017-04-23 DIAGNOSIS — I10 Essential (primary) hypertension: Secondary | ICD-10-CM | POA: Diagnosis not present

## 2017-04-24 DIAGNOSIS — M25672 Stiffness of left ankle, not elsewhere classified: Secondary | ICD-10-CM | POA: Diagnosis not present

## 2017-04-24 DIAGNOSIS — M25572 Pain in left ankle and joints of left foot: Secondary | ICD-10-CM | POA: Diagnosis not present

## 2017-04-24 DIAGNOSIS — M6281 Muscle weakness (generalized): Secondary | ICD-10-CM | POA: Diagnosis not present

## 2017-04-29 DIAGNOSIS — M25572 Pain in left ankle and joints of left foot: Secondary | ICD-10-CM | POA: Diagnosis not present

## 2017-04-29 DIAGNOSIS — M6281 Muscle weakness (generalized): Secondary | ICD-10-CM | POA: Diagnosis not present

## 2017-04-29 DIAGNOSIS — M25672 Stiffness of left ankle, not elsewhere classified: Secondary | ICD-10-CM | POA: Diagnosis not present

## 2017-04-30 DIAGNOSIS — I10 Essential (primary) hypertension: Secondary | ICD-10-CM | POA: Diagnosis not present

## 2017-05-01 DIAGNOSIS — M25572 Pain in left ankle and joints of left foot: Secondary | ICD-10-CM | POA: Diagnosis not present

## 2017-05-01 DIAGNOSIS — M25672 Stiffness of left ankle, not elsewhere classified: Secondary | ICD-10-CM | POA: Diagnosis not present

## 2017-05-01 DIAGNOSIS — M6281 Muscle weakness (generalized): Secondary | ICD-10-CM | POA: Diagnosis not present

## 2017-05-05 DIAGNOSIS — M25672 Stiffness of left ankle, not elsewhere classified: Secondary | ICD-10-CM | POA: Diagnosis not present

## 2017-05-05 DIAGNOSIS — M6281 Muscle weakness (generalized): Secondary | ICD-10-CM | POA: Diagnosis not present

## 2017-05-05 DIAGNOSIS — M25572 Pain in left ankle and joints of left foot: Secondary | ICD-10-CM | POA: Diagnosis not present

## 2017-05-08 DIAGNOSIS — Z630 Problems in relationship with spouse or partner: Secondary | ICD-10-CM | POA: Diagnosis not present

## 2017-05-12 DIAGNOSIS — M25572 Pain in left ankle and joints of left foot: Secondary | ICD-10-CM | POA: Diagnosis not present

## 2017-05-13 DIAGNOSIS — M25572 Pain in left ankle and joints of left foot: Secondary | ICD-10-CM | POA: Diagnosis not present

## 2017-05-13 DIAGNOSIS — M6281 Muscle weakness (generalized): Secondary | ICD-10-CM | POA: Diagnosis not present

## 2017-05-13 DIAGNOSIS — M25672 Stiffness of left ankle, not elsewhere classified: Secondary | ICD-10-CM | POA: Diagnosis not present

## 2017-05-15 DIAGNOSIS — M6281 Muscle weakness (generalized): Secondary | ICD-10-CM | POA: Diagnosis not present

## 2017-05-15 DIAGNOSIS — M25572 Pain in left ankle and joints of left foot: Secondary | ICD-10-CM | POA: Diagnosis not present

## 2017-05-15 DIAGNOSIS — M25672 Stiffness of left ankle, not elsewhere classified: Secondary | ICD-10-CM | POA: Diagnosis not present

## 2017-05-22 DIAGNOSIS — I10 Essential (primary) hypertension: Secondary | ICD-10-CM | POA: Diagnosis not present

## 2017-05-27 DIAGNOSIS — R232 Flushing: Secondary | ICD-10-CM | POA: Diagnosis not present

## 2017-05-27 DIAGNOSIS — N926 Irregular menstruation, unspecified: Secondary | ICD-10-CM | POA: Diagnosis not present

## 2017-06-09 DIAGNOSIS — N926 Irregular menstruation, unspecified: Secondary | ICD-10-CM | POA: Diagnosis not present

## 2017-06-18 DIAGNOSIS — I10 Essential (primary) hypertension: Secondary | ICD-10-CM | POA: Diagnosis not present

## 2017-06-18 DIAGNOSIS — Z0000 Encounter for general adult medical examination without abnormal findings: Secondary | ICD-10-CM | POA: Diagnosis not present

## 2017-06-18 DIAGNOSIS — Z23 Encounter for immunization: Secondary | ICD-10-CM | POA: Diagnosis not present

## 2017-06-18 DIAGNOSIS — E1121 Type 2 diabetes mellitus with diabetic nephropathy: Secondary | ICD-10-CM | POA: Diagnosis not present

## 2017-06-26 DIAGNOSIS — Z630 Problems in relationship with spouse or partner: Secondary | ICD-10-CM | POA: Diagnosis not present

## 2017-07-02 DIAGNOSIS — N921 Excessive and frequent menstruation with irregular cycle: Secondary | ICD-10-CM | POA: Diagnosis not present

## 2017-07-02 DIAGNOSIS — N760 Acute vaginitis: Secondary | ICD-10-CM | POA: Diagnosis not present

## 2017-07-02 DIAGNOSIS — R9389 Abnormal findings on diagnostic imaging of other specified body structures: Secondary | ICD-10-CM | POA: Diagnosis not present

## 2017-07-14 DIAGNOSIS — I10 Essential (primary) hypertension: Secondary | ICD-10-CM | POA: Diagnosis not present

## 2017-07-15 DIAGNOSIS — Z630 Problems in relationship with spouse or partner: Secondary | ICD-10-CM | POA: Diagnosis not present

## 2017-07-16 DIAGNOSIS — N920 Excessive and frequent menstruation with regular cycle: Secondary | ICD-10-CM | POA: Diagnosis not present

## 2017-08-05 DIAGNOSIS — H25013 Cortical age-related cataract, bilateral: Secondary | ICD-10-CM | POA: Diagnosis not present

## 2017-08-05 DIAGNOSIS — H40013 Open angle with borderline findings, low risk, bilateral: Secondary | ICD-10-CM | POA: Diagnosis not present

## 2017-08-05 DIAGNOSIS — H04123 Dry eye syndrome of bilateral lacrimal glands: Secondary | ICD-10-CM | POA: Diagnosis not present

## 2017-08-07 DIAGNOSIS — Z630 Problems in relationship with spouse or partner: Secondary | ICD-10-CM | POA: Diagnosis not present

## 2017-08-27 DIAGNOSIS — I10 Essential (primary) hypertension: Secondary | ICD-10-CM | POA: Diagnosis not present

## 2017-09-11 DIAGNOSIS — Z630 Problems in relationship with spouse or partner: Secondary | ICD-10-CM | POA: Diagnosis not present

## 2017-10-01 DIAGNOSIS — I10 Essential (primary) hypertension: Secondary | ICD-10-CM | POA: Diagnosis not present

## 2017-10-09 DIAGNOSIS — Z630 Problems in relationship with spouse or partner: Secondary | ICD-10-CM | POA: Diagnosis not present

## 2017-11-06 DIAGNOSIS — I10 Essential (primary) hypertension: Secondary | ICD-10-CM | POA: Diagnosis not present

## 2017-12-02 DIAGNOSIS — Z630 Problems in relationship with spouse or partner: Secondary | ICD-10-CM | POA: Diagnosis not present

## 2017-12-02 DIAGNOSIS — I10 Essential (primary) hypertension: Secondary | ICD-10-CM | POA: Diagnosis not present

## 2017-12-11 DIAGNOSIS — R0981 Nasal congestion: Secondary | ICD-10-CM | POA: Diagnosis not present

## 2017-12-11 DIAGNOSIS — J028 Acute pharyngitis due to other specified organisms: Secondary | ICD-10-CM | POA: Diagnosis not present

## 2017-12-17 DIAGNOSIS — I10 Essential (primary) hypertension: Secondary | ICD-10-CM | POA: Diagnosis not present

## 2017-12-17 DIAGNOSIS — E1121 Type 2 diabetes mellitus with diabetic nephropathy: Secondary | ICD-10-CM | POA: Diagnosis not present

## 2018-01-01 DIAGNOSIS — Z630 Problems in relationship with spouse or partner: Secondary | ICD-10-CM | POA: Diagnosis not present

## 2018-01-27 DIAGNOSIS — Z713 Dietary counseling and surveillance: Secondary | ICD-10-CM | POA: Diagnosis not present

## 2018-01-27 DIAGNOSIS — Z630 Problems in relationship with spouse or partner: Secondary | ICD-10-CM | POA: Diagnosis not present

## 2018-02-17 DIAGNOSIS — Z630 Problems in relationship with spouse or partner: Secondary | ICD-10-CM | POA: Diagnosis not present

## 2018-02-25 DIAGNOSIS — Z118 Encounter for screening for other infectious and parasitic diseases: Secondary | ICD-10-CM | POA: Diagnosis not present

## 2018-02-25 DIAGNOSIS — Z01419 Encounter for gynecological examination (general) (routine) without abnormal findings: Secondary | ICD-10-CM | POA: Diagnosis not present

## 2018-02-25 DIAGNOSIS — Z1159 Encounter for screening for other viral diseases: Secondary | ICD-10-CM | POA: Diagnosis not present

## 2018-03-17 DIAGNOSIS — Z630 Problems in relationship with spouse or partner: Secondary | ICD-10-CM | POA: Diagnosis not present

## 2018-03-17 DIAGNOSIS — I10 Essential (primary) hypertension: Secondary | ICD-10-CM | POA: Diagnosis not present

## 2019-03-10 ENCOUNTER — Other Ambulatory Visit: Payer: Self-pay

## 2019-03-10 DIAGNOSIS — Z20822 Contact with and (suspected) exposure to covid-19: Secondary | ICD-10-CM

## 2019-03-13 LAB — NOVEL CORONAVIRUS, NAA: SARS-CoV-2, NAA: NOT DETECTED

## 2019-03-13 LAB — SPECIMEN STATUS REPORT

## 2019-03-29 ENCOUNTER — Other Ambulatory Visit: Payer: Self-pay | Admitting: Obstetrics and Gynecology

## 2019-04-08 ENCOUNTER — Encounter (HOSPITAL_BASED_OUTPATIENT_CLINIC_OR_DEPARTMENT_OTHER): Payer: Self-pay | Admitting: *Deleted

## 2019-04-09 ENCOUNTER — Other Ambulatory Visit: Payer: Self-pay

## 2019-04-09 ENCOUNTER — Other Ambulatory Visit (HOSPITAL_COMMUNITY)
Admission: RE | Admit: 2019-04-09 | Discharge: 2019-04-09 | Disposition: A | Discharge status: Home / Self Care | Payer: 59 | Source: Ambulatory Visit | Attending: Obstetrics and Gynecology | Admitting: Obstetrics and Gynecology

## 2019-04-09 ENCOUNTER — Encounter (HOSPITAL_COMMUNITY)
Admission: RE | Admit: 2019-04-09 | Discharge: 2019-04-09 | Disposition: A | Discharge status: Home / Self Care | Payer: 59 | Source: Ambulatory Visit | Attending: Obstetrics and Gynecology | Admitting: Obstetrics and Gynecology

## 2019-04-09 DIAGNOSIS — Z79899 Other long term (current) drug therapy: Secondary | ICD-10-CM | POA: Diagnosis not present

## 2019-04-09 DIAGNOSIS — I10 Essential (primary) hypertension: Secondary | ICD-10-CM | POA: Diagnosis not present

## 2019-04-09 DIAGNOSIS — Z20828 Contact with and (suspected) exposure to other viral communicable diseases: Secondary | ICD-10-CM | POA: Diagnosis not present

## 2019-04-09 DIAGNOSIS — N921 Excessive and frequent menstruation with irregular cycle: Secondary | ICD-10-CM | POA: Diagnosis not present

## 2019-04-09 DIAGNOSIS — F329 Major depressive disorder, single episode, unspecified: Secondary | ICD-10-CM | POA: Diagnosis not present

## 2019-04-09 DIAGNOSIS — E119 Type 2 diabetes mellitus without complications: Secondary | ICD-10-CM | POA: Diagnosis not present

## 2019-04-09 DIAGNOSIS — Z01812 Encounter for preprocedural laboratory examination: Secondary | ICD-10-CM | POA: Insufficient documentation

## 2019-04-09 DIAGNOSIS — D509 Iron deficiency anemia, unspecified: Secondary | ICD-10-CM | POA: Diagnosis not present

## 2019-04-09 DIAGNOSIS — N840 Polyp of corpus uteri: Secondary | ICD-10-CM | POA: Diagnosis present

## 2019-04-09 DIAGNOSIS — F419 Anxiety disorder, unspecified: Secondary | ICD-10-CM | POA: Diagnosis not present

## 2019-04-09 LAB — CBC
HCT: 37.7 % (ref 36.0–46.0)
Hemoglobin: 11.5 g/dL — ABNORMAL LOW (ref 12.0–15.0)
MCH: 25.5 pg — ABNORMAL LOW (ref 26.0–34.0)
MCHC: 30.5 g/dL (ref 30.0–36.0)
MCV: 83.6 fL (ref 80.0–100.0)
Platelets: 323 10*3/uL (ref 150–400)
RBC: 4.51 MIL/uL (ref 3.87–5.11)
RDW: 14.5 % (ref 11.5–15.5)
WBC: 7.9 10*3/uL (ref 4.0–10.5)
nRBC: 0 % (ref 0.0–0.2)

## 2019-04-09 LAB — HCG, SERUM, QUALITATIVE: Preg, Serum: NEGATIVE

## 2019-04-09 LAB — BASIC METABOLIC PANEL
Anion gap: 8 (ref 5–15)
BUN: 18 mg/dL (ref 6–20)
CO2: 28 mmol/L (ref 22–32)
Calcium: 9.1 mg/dL (ref 8.9–10.3)
Chloride: 104 mmol/L (ref 98–111)
Creatinine, Ser: 0.85 mg/dL (ref 0.44–1.00)
GFR calc Af Amer: >60 mL/min (ref >60)
GFR calc non Af Amer: >60 mL/min (ref >60)
Glucose, Bld: 92 mg/dL (ref 70–99)
Potassium: 4 mmol/L (ref 3.5–5.1)
Sodium: 140 mmol/L (ref 135–145)

## 2019-04-09 LAB — SARS CORONAVIRUS 2 (TAT 6-24 HRS): SARS Coronavirus 2: NEGATIVE

## 2019-04-12 ENCOUNTER — Other Ambulatory Visit: Payer: Self-pay

## 2019-04-12 ENCOUNTER — Encounter (HOSPITAL_BASED_OUTPATIENT_CLINIC_OR_DEPARTMENT_OTHER): Payer: Self-pay | Admitting: *Deleted

## 2019-04-12 NOTE — Progress Notes (Signed)
Spoke w/ pt via phone for pre-op interview.  Npo after mn.  Arrive at 0730.  Current lab results and ekg dated 04-09-2019 in chart and epic.  Pt had covid test done 04-09-2019.  Will take zoloft and wellbutrin am dos w/ sips of water.

## 2019-04-13 ENCOUNTER — Ambulatory Visit (HOSPITAL_BASED_OUTPATIENT_CLINIC_OR_DEPARTMENT_OTHER): Payer: 59 | Admitting: Anesthesiology

## 2019-04-13 ENCOUNTER — Encounter (HOSPITAL_BASED_OUTPATIENT_CLINIC_OR_DEPARTMENT_OTHER)
Admission: RE | Disposition: A | Discharge status: Home / Self Care | Payer: Self-pay | Source: Home / Self Care | Attending: Obstetrics and Gynecology

## 2019-04-13 ENCOUNTER — Encounter (HOSPITAL_BASED_OUTPATIENT_CLINIC_OR_DEPARTMENT_OTHER): Payer: Self-pay

## 2019-04-13 ENCOUNTER — Ambulatory Visit (HOSPITAL_BASED_OUTPATIENT_CLINIC_OR_DEPARTMENT_OTHER)
Admission: RE | Admit: 2019-04-13 | Discharge: 2019-04-13 | Disposition: A | Discharge status: Home / Self Care | Payer: 59 | Attending: Obstetrics and Gynecology | Admitting: Obstetrics and Gynecology

## 2019-04-13 DIAGNOSIS — F419 Anxiety disorder, unspecified: Secondary | ICD-10-CM | POA: Insufficient documentation

## 2019-04-13 DIAGNOSIS — F329 Major depressive disorder, single episode, unspecified: Secondary | ICD-10-CM | POA: Insufficient documentation

## 2019-04-13 DIAGNOSIS — N840 Polyp of corpus uteri: Secondary | ICD-10-CM | POA: Diagnosis not present

## 2019-04-13 DIAGNOSIS — Z79899 Other long term (current) drug therapy: Secondary | ICD-10-CM | POA: Insufficient documentation

## 2019-04-13 DIAGNOSIS — N921 Excessive and frequent menstruation with irregular cycle: Secondary | ICD-10-CM | POA: Insufficient documentation

## 2019-04-13 DIAGNOSIS — E119 Type 2 diabetes mellitus without complications: Secondary | ICD-10-CM | POA: Insufficient documentation

## 2019-04-13 DIAGNOSIS — D509 Iron deficiency anemia, unspecified: Secondary | ICD-10-CM | POA: Insufficient documentation

## 2019-04-13 DIAGNOSIS — I10 Essential (primary) hypertension: Secondary | ICD-10-CM | POA: Insufficient documentation

## 2019-04-13 HISTORY — DX: Contact with and (suspected) exposure to tuberculosis: Z20.1

## 2019-04-13 HISTORY — DX: Personal history of other specified conditions: Z87.898

## 2019-04-13 HISTORY — DX: Leiomyoma of uterus, unspecified: D25.9

## 2019-04-13 HISTORY — DX: Polyp of corpus uteri: N84.0

## 2019-04-13 HISTORY — DX: Iron deficiency anemia, unspecified: D50.9

## 2019-04-13 HISTORY — DX: Presence of spectacles and contact lenses: Z97.3

## 2019-04-13 HISTORY — PX: DILATATION & CURETTAGE/HYSTEROSCOPY WITH MYOSURE: SHX6511

## 2019-04-13 HISTORY — DX: Personal history of other diseases of the female genital tract: Z87.42

## 2019-04-13 HISTORY — DX: Prediabetes: R73.03

## 2019-04-13 LAB — GLUCOSE, CAPILLARY
Glucose-Capillary: 82 mg/dL (ref 70–99)
Glucose-Capillary: 64 mg/dL — ABNORMAL LOW (ref 70–99)
Glucose-Capillary: 71 mg/dL (ref 70–99)
Glucose-Capillary: 118 mg/dL — ABNORMAL HIGH (ref 70–99)

## 2019-04-13 SURGERY — DILATATION & CURETTAGE/HYSTEROSCOPY WITH MYOSURE
Anesthesia: General

## 2019-04-13 MED ORDER — LIDOCAINE 2% (20 MG/ML) 5 ML SYRINGE
INTRAMUSCULAR | Status: DC | PRN
  Administered 2019-04-13: 100 mg via INTRAVENOUS

## 2019-04-13 MED ORDER — PROPOFOL 10 MG/ML IV BOLUS
INTRAVENOUS | Status: DC | PRN
  Administered 2019-04-13: 200 mg via INTRAVENOUS

## 2019-04-13 MED ORDER — FENTANYL CITRATE (PF) 100 MCG/2ML IJ SOLN
INTRAMUSCULAR | Status: AC
  Filled 2019-04-13: qty 2

## 2019-04-13 MED ORDER — IBUPROFEN 800 MG PO TABS: 800.0000 mg | ORAL_TABLET | Freq: Three times a day (TID) | ORAL | 0 refills | Status: DC | PRN

## 2019-04-13 MED ORDER — LACTATED RINGERS IV SOLN
INTRAVENOUS | Status: DC
  Administered 2019-04-13: 08:00:00 via INTRAVENOUS
  Administered 2019-04-13: 1000 mL via INTRAVENOUS
  Filled 2019-04-13: qty 1000

## 2019-04-13 MED ORDER — MIDAZOLAM HCL 2 MG/2ML IJ SOLN
INTRAMUSCULAR | Status: DC | PRN
  Administered 2019-04-13: 2 mg via INTRAVENOUS

## 2019-04-13 MED ORDER — MEPERIDINE HCL 25 MG/ML IJ SOLN
6.2500 mg | INTRAMUSCULAR | Status: DC | PRN
  Filled 2019-04-13: qty 1

## 2019-04-13 MED ORDER — ACETAMINOPHEN 325 MG PO TABS
325.0000 mg | ORAL_TABLET | ORAL | Status: DC | PRN
  Filled 2019-04-13: qty 2

## 2019-04-13 MED ORDER — ONDANSETRON HCL 4 MG/2ML IJ SOLN
INTRAMUSCULAR | Status: DC | PRN
  Administered 2019-04-13: 4 mg via INTRAVENOUS

## 2019-04-13 MED ORDER — ACETAMINOPHEN 500 MG PO TABS
ORAL_TABLET | ORAL | Status: AC
  Filled 2019-04-13: qty 2

## 2019-04-13 MED ORDER — ACETAMINOPHEN 160 MG/5ML PO SOLN
325.0000 mg | ORAL | Status: DC | PRN
  Filled 2019-04-13: qty 20.3

## 2019-04-13 MED ORDER — OXYCODONE HCL 5 MG PO TABS
5.0000 mg | ORAL_TABLET | Freq: Once | ORAL | Status: DC | PRN
  Filled 2019-04-13: qty 1

## 2019-04-13 MED ORDER — KETOROLAC TROMETHAMINE 30 MG/ML IJ SOLN
INTRAMUSCULAR | Status: DC | PRN
  Administered 2019-04-13: 30 mg via INTRAVENOUS

## 2019-04-13 MED ORDER — ONDANSETRON HCL 4 MG/2ML IJ SOLN
INTRAMUSCULAR | Status: AC
  Filled 2019-04-13: qty 2

## 2019-04-13 MED ORDER — FENTANYL CITRATE (PF) 100 MCG/2ML IJ SOLN
25.0000 ug | INTRAMUSCULAR | Status: DC | PRN
  Filled 2019-04-13: qty 1

## 2019-04-13 MED ORDER — ONDANSETRON HCL 4 MG/2ML IJ SOLN
4.0000 mg | Freq: Once | INTRAMUSCULAR | Status: DC | PRN
  Filled 2019-04-13: qty 2

## 2019-04-13 MED ORDER — FENTANYL CITRATE (PF) 100 MCG/2ML IJ SOLN
INTRAMUSCULAR | Status: DC | PRN
  Administered 2019-04-13: 50 ug via INTRAVENOUS

## 2019-04-13 MED ORDER — SCOPOLAMINE 1 MG/3DAYS TD PT72
MEDICATED_PATCH | TRANSDERMAL | Status: AC
  Filled 2019-04-13: qty 1

## 2019-04-13 MED ORDER — KETOROLAC TROMETHAMINE 30 MG/ML IJ SOLN
30.0000 mg | Freq: Once | INTRAMUSCULAR | Status: DC | PRN
  Filled 2019-04-13: qty 1

## 2019-04-13 MED ORDER — ACETAMINOPHEN 325 MG PO TABS
ORAL_TABLET | ORAL | Status: DC | PRN
  Administered 2019-04-13: 1000 mg via ORAL

## 2019-04-13 MED ORDER — OXYCODONE HCL 5 MG/5ML PO SOLN
5.0000 mg | Freq: Once | ORAL | Status: DC | PRN
  Filled 2019-04-13: qty 5

## 2019-04-13 MED ORDER — DEXAMETHASONE SODIUM PHOSPHATE 10 MG/ML IJ SOLN
INTRAMUSCULAR | Status: AC
  Filled 2019-04-13: qty 1

## 2019-04-13 MED ORDER — SCOPOLAMINE 1 MG/3DAYS TD PT72
MEDICATED_PATCH | TRANSDERMAL | Status: DC | PRN
  Administered 2019-04-13: 1 via TRANSDERMAL

## 2019-04-13 MED ORDER — DEXAMETHASONE SODIUM PHOSPHATE 10 MG/ML IJ SOLN
INTRAMUSCULAR | Status: DC | PRN
  Administered 2019-04-13: 5 mg via INTRAVENOUS

## 2019-04-13 MED ORDER — MIDAZOLAM HCL 2 MG/2ML IJ SOLN
INTRAMUSCULAR | Status: AC
  Filled 2019-04-13: qty 2

## 2019-04-13 MED ORDER — PROPOFOL 10 MG/ML IV BOLUS
INTRAVENOUS | Status: AC
  Filled 2019-04-13: qty 20

## 2019-04-13 MED ORDER — SODIUM CHLORIDE 0.9 % IR SOLN
Status: DC | PRN
  Administered 2019-04-13 (×2): 3000 mL

## 2019-04-13 SURGICAL SUPPLY — 14 items
CATH ROBINSON RED A/P 16FR (CATHETERS) ×2 IMPLANT
DEVICE MYOSURE LITE (MISCELLANEOUS) ×1 IMPLANT
DEVICE MYOSURE REACH (MISCELLANEOUS) IMPLANT
GLOVE BIOGEL PI IND STRL 6.5 (GLOVE) ×1 IMPLANT
GLOVE BIOGEL PI INDICATOR 6.5 (GLOVE) ×1
GLOVE ECLIPSE 6.5 STRL STRAW (GLOVE) ×2 IMPLANT
GOWN STRL REUS W/TWL LRG LVL3 (GOWN DISPOSABLE) ×4 IMPLANT
HIBICLENS CHG 4% 4OZ BTL (MISCELLANEOUS) IMPLANT
KIT PROCEDURE FLUENT (KITS) ×2 IMPLANT
PACK VAGINAL MINOR WOMEN LF (CUSTOM PROCEDURE TRAY) ×2 IMPLANT
PAD OB MATERNITY 4.3X12.25 (PERSONAL CARE ITEMS) ×2 IMPLANT
SEAL CERVICAL OMNI LOK (ABLATOR) ×1 IMPLANT
SEAL ROD LENS SCOPE MYOSURE (ABLATOR) ×2 IMPLANT
TOWEL OR 17X26 10 PK STRL BLUE (TOWEL DISPOSABLE) ×2 IMPLANT

## 2019-04-13 NOTE — Discharge Instructions (Signed)

## 2019-04-13 NOTE — Anesthesia Procedure Notes (Signed)
Procedure Name: LMA Insertion Date/Time: 04/13/2019 9:38 AM Performed by: Wanita Chamberlain, CRNA Pre-anesthesia Checklist: Patient identified, Emergency Drugs available, Suction available and Patient being monitored Patient Re-evaluated:Patient Re-evaluated prior to induction Oxygen Delivery Method: Circle system utilized Preoxygenation: Pre-oxygenation with 100% oxygen Induction Type: IV induction Ventilation: Mask ventilation without difficulty LMA: LMA inserted LMA Size: 4.0 Number of attempts: 1 Placement Confirmation: breath sounds checked- equal and bilateral,  CO2 detector and positive ETCO2 Tube secured with: Tape Dental Injury: Teeth and Oropharynx as per pre-operative assessment

## 2019-04-13 NOTE — Progress Notes (Signed)
Hypoglycemic Event  CBG: 64  Treatment: 4 oz juice/soda  Symptoms: None  Follow-up CBG: Time:1100 CBG Result:71  Possible Reasons for Event: Inadequate meal intake (NPO since MN for surgery)  Comments/MD notified: Patient asymptomatic, alert and oriented, tolerating PO intake.   Dorrene German

## 2019-04-13 NOTE — Op Note (Signed)
Preoperative diagnosis:  Menometrorrhagia, endometrial polyp  Postop diagnosis: as above.  Procedure: Hysteroscopic polypectomy with, D&C, myosure Anesthesia General via LMA  Surgeon: Tiana Loft, MD  Assistant:none IV fluids : 756ml Estimated blood loss : 58ml Urine output: straight catheter preop  : [id number] Complications none  Condition stable  Disposition PACU  Specimen: endometrial polyp with endometrial curettings   Procedure  Indication: Menometrorhagia. Office sono noted endometrial polyp. Patient was counseled on risks/ complications including infection, bleeding, damage to internal organs, she understood and agrees, gave informed written consent.  Patient was brought to the operating room with IV running. Time out was carried out. She underwent general anesthesia via LMA without complications. She was placed in the dorsolithotomy position. The patient was prepped and draped in standard fashion. Bladder was catheterized once. Bimanual exam revealed uterus to be anteverted and normal size. Speculum was placed and cervix was grasped with single-tooth tenaculum. Cervical block with 20 cc 1% plain Xylocaine given. The uterus was sounded to 9 cm. Cervical os was dilated to 21 [place] dilator. Myosure hysteroscope was introduced in the uterine cavity under visualization. Findings: polyp noted with base at posterior cavity.  Other small appearing polyp anterior/fundal.  Myosure used to remove and clear cavity noted after myosure device used.  The cervical occluder for myosure was used d/t poor seal and fluid leaking out of cx os. Specimen sent to path. Hysteroscope was removed and tenaculum removed. Fluid deficit 200 cc.  All counts are correct x2. No complications. Patient was made supine dorsal anesthesia and brought to the recovery room in stable condition.  Patient will be discharged home today. Discharge meds ibuprofen. Follow up in 2 weeks in office. Warning signs of infection and excessive  bleeding reviewed.

## 2019-04-13 NOTE — Transfer of Care (Signed)
Immediate Anesthesia Transfer of Care Note  Patient: Alexandra Hale  Procedure(s) Performed: DILATATION & CURETTAGE/HYSTEROSCOPY WITH MYOSURE (N/A )  Patient Location: PACU  Anesthesia Type:General  Level of Consciousness: awake, alert , oriented and patient cooperative  Airway & Oxygen Therapy: Patient Spontanous Breathing and Patient connected to nasal cannula oxygen  Post-op Assessment: Report given to RN and Post -op Vital signs reviewed and stable  Post vital signs: Reviewed and stable  Last Vitals:  Vitals Value Taken Time  BP    Temp    Pulse    Resp    SpO2      Last Pain:  Vitals:   04/13/19 0744  TempSrc: Oral  PainSc: 2       Patients Stated Pain Goal: 6 ([id number])  Complications: No apparent anesthesia complications

## 2019-04-13 NOTE — H&P (Signed)
Alexandra Hale is an 48 y.o. female with h/o menometrorrhagia.  Sis performed and c/w endometrial polyp.  Pt here for D&C, h/s, polypectomy.  Pertinent Gynecological History: Aub, see above  Menstrual History: Lmp: 04/11/19    Past Medical History:  Diagnosis Date  . Anxiety   . Depression   . Endometrial polyp   . History of abnormal cervical Pap smear   . History of cardiac murmur as a child   . History of exposure to tuberculosis    per pt father had TB 1989 and she was treated for exposure with medication for 6 months  . Hypertension   . IDA (iron deficiency anemia)   . Pre-diabetes   . Uterine fibroid   . Wears contact lenses     Past Surgical History:  Procedure Laterality Date  . CERVICAL BIOPSY  W/ LOOP ELECTRODE EXCISION  1995  . COMBINED HYSTEROSCOPY DIAGNOSTIC / D&C  06/28/1999  . LAPAROSCOPIC TUBAL LIGATION Bilateral 05/20/2014   Procedure: LAPAROSCOPIC TUBAL LIGATION ;  Surgeon: Princess Bruins, MD;  Location: [facility];  Service: Gynecology;  Laterality: Bilateral;  . RETINAL DETACHMENT SURGERY  2016  appprox.   unilateral  . WISDOM TOOTH EXTRACTION  1997    Family History  Problem Relation Age of Onset  . Hypertension Mother   . Deep vein thrombosis Mother   . Cancer Father   . Heart disease Maternal Grandmother   . Anesthesia problems Neg Hx     Social History:  reports that she has never smoked. She has never used smokeless tobacco. She reports that she does not drink alcohol or use drugs.  Allergies: No Known Allergies  Medications Prior to Admission  Medication Sig Dispense Refill Last Dose  . buPROPion (WELLBUTRIN SR) 150 MG 12 hr tablet Take 150 mg by mouth 2 (two) times daily.   04/13/2019 at [place]  . buPROPion (WELLBUTRIN XL) 150 MG 24 hr tablet Take 150 mg by mouth daily.   04/13/2019 at [place]  . ferrous sulfate 325 (65 FE) MG tablet Take 325 mg by mouth daily with breakfast. OTC   04/12/2019 at Unknown time  . hydrochlorothiazide (HYDRODIURIL) 25  MG tablet Take 25 mg by mouth daily.   04/12/2019 at Unknown time  . liraglutide (VICTOZA) 18 MG/3ML SOPN Inject 1.8 mg into the skin daily.   04/12/2019 at Unknown time  . losartan (COZAAR) 50 MG tablet Take 50 mg by mouth daily.   04/12/2019 at Unknown time  . sertraline (ZOLOFT) 25 MG tablet Take 25 mg by mouth daily.   04/13/2019 at 0715    ROS SOB/chest pain/ HA/ vision changes/ LE pain or swelling/ etc.  Blood pressure 135/83, pulse 68, temperature 98 F (36.7 C), temperature source Oral, resp. rate 16, height 5\' 3"  (1.6 m), weight 74.4 kg, last menstrual period 04/11/2019, SpO2 100 %, unknown if currently breastfeeding. Physical Exam A&O x 3 HEENT : grossly wnl Lungs : ctab CV : rrr Abdo : soft, nt, nd Extr : no edema, nt bilat Pelvic ; deferred   Results for orders placed or performed during the hospital encounter of 04/13/19 (from the past 24 hour(s))  Glucose, capillary     Status: None   Collection Time: 04/13/19  8:25 AM  Result Value Ref Range   Glucose-Capillary 82 70 - 99 mg/dL   CBC Latest Ref Rng & Units 04/09/2019 05/17/2014 01/05/2012  WBC 4.0 - 10.5 K/uL 7.9 8.2 14.4(H)  Hemoglobin 12.0 - 15.0 g/dL 11.5(L) 13.8 11.5(L)  Hematocrit 36.0 - 46.0 % 37.7 42.1 34.8(L)  Platelets 150 - 400 K/uL 323 232 146(L)     No results found.  Assessment/Plan:endometrial polyp 1. Proceed to OR for planned surgery; reviewed risks of procedure including bleeding, infection, uterine perforation, further surgery, risk of anesthesia, risk of blood clot to legs, lungs.consent signed  Charyl Bigger 04/13/2019, 9:28 AM

## 2019-04-13 NOTE — Anesthesia Postprocedure Evaluation (Signed)
Anesthesia Post Note  Patient: Alexandra Hale  Procedure(s) Performed: DILATATION & CURETTAGE/HYSTEROSCOPY WITH MYOSURE (N/A )     Patient location during evaluation: PACU Anesthesia Type: General Level of consciousness: awake Pain management: pain level controlled Vital Signs Assessment: post-procedure vital signs reviewed and stable Respiratory status: spontaneous breathing Cardiovascular status: stable Postop Assessment: no apparent nausea or vomiting Anesthetic complications: no    Last Vitals:  Vitals:   04/13/19 1100 04/13/19 1106  BP: 137/89 132/90  Pulse: 62 62  Resp: 15 13  Temp:    SpO2: 99% 100%    Last Pain:  Vitals:   04/13/19 1100  TempSrc:   PainSc: 0-No pain   Pain Goal: Patients Stated Pain Goal: 6 (04/13/19 0744)                 Huston Foley

## 2019-04-13 NOTE — Anesthesia Preprocedure Evaluation (Addendum)
Anesthesia Evaluation  Patient identified by MRN, date of birth, ID band Patient awake    Reviewed: Allergy & Precautions, NPO status , Patient's Chart, lab work & pertinent test results  Airway Mallampati: I  TM Distance: >3 FB Neck ROM: Full    Dental no notable dental hx. (+) Teeth Intact, Dental Advisory Given   Pulmonary neg pulmonary ROS,    Pulmonary exam normal breath sounds clear to auscultation       Cardiovascular hypertension, Pt. on medications negative cardio ROS Normal cardiovascular exam Rhythm:Regular Rate:Normal     Neuro/Psych PSYCHIATRIC DISORDERS Anxiety Depression negative neurological ROS     GI/Hepatic negative GI ROS, Neg liver ROS,   Endo/Other  diabetes, Well Controlled, Type 2  Renal/GU negative Renal ROS  negative genitourinary   Musculoskeletal negative musculoskeletal ROS (+)   Abdominal Normal abdominal exam  (+)   Peds negative pediatric ROS (+)  Hematology  (+) anemia ,   Anesthesia Other Findings   Reproductive/Obstetrics negative OB ROS                           Anesthesia Physical Anesthesia Plan  ASA: II  Anesthesia Plan: General   Post-op Pain Management:    Induction: Intravenous  PONV Risk Score and Plan: 4 or greater and Ondansetron, Dexamethasone, Midazolam and Scopolamine patch - Pre-op  Airway Management Planned: LMA  Additional Equipment:   Intra-op Plan:   Post-operative Plan: Extubation in OR  Informed Consent: I have reviewed the patients History and Physical, chart, labs and discussed the procedure including the risks, benefits and alternatives for the proposed anesthesia with the patient or authorized representative who has indicated his/her understanding and acceptance.     Dental advisory given  Plan Discussed with: CRNA  Anesthesia Plan Comments:         Anesthesia Quick Evaluation

## 2019-04-13 NOTE — Discharge Summary (Signed)
Physician Discharge Summary  Patient ID: Alexandra Hale MRN: [id number] DOB/AGE: August 13, 1971 48 y.o.  Admit date: 04/13/2019 Discharge date: 04/13/2019  Admission Diagnoses: endometrial polyp, aub  Discharge Diagnoses:  Active Problems:   * No active hospital problems. *   Discharged Condition: good  Hospital Course: Patient underwent uncomplicated D&C, hysteroscopy, polypectomy with myosure.  Minimal bleeding and tolerated procedure well.  Consults: None  Significant Diagnostic Studies: n/a  Treatments: surgery: D&C, hysteroscopy with myosure polypectomy  Discharge Exam: Blood pressure 132/90, pulse 62, temperature 98.5 F (36.9 C), resp. rate 13, height 5\' 3"  (1.6 m), weight 74.4 kg, last menstrual period 04/11/2019, SpO2 100 %, unknown if currently breastfeeding.   Disposition:    Allergies as of 04/13/2019   No Known Allergies     Medication List    TAKE these medications   buPROPion 150 MG 12 hr tablet Commonly known as: WELLBUTRIN SR Take 150 mg by mouth 2 (two) times daily.   buPROPion 150 MG 24 hr tablet Commonly known as: WELLBUTRIN XL Take 150 mg by mouth daily.   ferrous sulfate 325 (65 FE) MG tablet Take 325 mg by mouth daily with breakfast. OTC   hydrochlorothiazide 25 MG tablet Commonly known as: HYDRODIURIL Take 25 mg by mouth daily.   ibuprofen 800 MG tablet Commonly known as: ADVIL Take 1 tablet (800 mg total) by mouth every 8 (eight) hours as needed.   liraglutide 18 MG/3ML Sopn Commonly known as: VICTOZA Inject 1.8 mg into the skin daily.   losartan 50 MG tablet Commonly known as: COZAAR Take 50 mg by mouth daily.   sertraline 25 MG tablet Commonly known as: ZOLOFT Take 25 mg by mouth daily.      Follow-up Information    Yaelis Scharfenberg, Earlyne Iba, MD Follow up.   Specialty: Obstetrics and Gynecology Contact information: [place] [address] [phone]           Signed: Charyl Bigger 04/13/2019, 11:27  AM

## 2019-04-14 ENCOUNTER — Encounter (HOSPITAL_BASED_OUTPATIENT_CLINIC_OR_DEPARTMENT_OTHER): Payer: Self-pay | Admitting: Obstetrics and Gynecology

## 2019-05-11 ENCOUNTER — Other Ambulatory Visit: Payer: Self-pay

## 2019-05-11 DIAGNOSIS — Z20822 Contact with and (suspected) exposure to covid-19: Secondary | ICD-10-CM

## 2019-05-12 LAB — NOVEL CORONAVIRUS, NAA: SARS-CoV-2, NAA: NOT DETECTED

## 2019-09-09 ENCOUNTER — Ambulatory Visit: Payer: 59 | Attending: Internal Medicine

## 2019-09-09 DIAGNOSIS — Z20822 Contact with and (suspected) exposure to covid-19: Secondary | ICD-10-CM

## 2019-09-10 LAB — NOVEL CORONAVIRUS, NAA: SARS-CoV-2, NAA: DETECTED — AB

## 2019-09-11 ENCOUNTER — Encounter: Payer: Self-pay | Admitting: Infectious Diseases

## 2019-09-22 ENCOUNTER — Emergency Department (HOSPITAL_COMMUNITY)
Admission: EM | Admit: 2019-09-22 | Discharge: 2019-09-22 | Disposition: A | Discharge status: Home / Self Care | Payer: 59 | Attending: Emergency Medicine | Admitting: Emergency Medicine

## 2019-09-22 ENCOUNTER — Other Ambulatory Visit: Payer: Self-pay

## 2019-09-22 ENCOUNTER — Emergency Department (HOSPITAL_COMMUNITY): Payer: 59

## 2019-09-22 DIAGNOSIS — U071 COVID-19: Secondary | ICD-10-CM

## 2019-09-22 DIAGNOSIS — Z79899 Other long term (current) drug therapy: Secondary | ICD-10-CM | POA: Diagnosis not present

## 2019-09-22 DIAGNOSIS — I10 Essential (primary) hypertension: Secondary | ICD-10-CM | POA: Diagnosis not present

## 2019-09-22 DIAGNOSIS — R05 Cough: Secondary | ICD-10-CM | POA: Diagnosis present

## 2019-09-22 MED ORDER — ONDANSETRON HCL 4 MG PO TABS: 4.0000 mg | ORAL_TABLET | Freq: Three times a day (TID) | ORAL | 0 refills | Status: DC | PRN

## 2019-09-22 MED ORDER — HYDROCOD POLST-CPM POLST ER 10-8 MG/5ML PO SUER: 5.0000 mL | Freq: Two times a day (BID) | ORAL | 0 refills | Status: DC | PRN

## 2019-09-22 MED ORDER — ONDANSETRON 8 MG PO TBDP
8.0000 mg | ORAL_TABLET | Freq: Once | ORAL | Status: AC
  Administered 2019-09-22: 8 mg via ORAL
  Filled 2019-09-22: qty 1

## 2019-09-22 NOTE — ED Provider Notes (Signed)
[facility] DEPT Provider Note   CSN: [id number] Arrival date & time: 09/22/19  1610     History Chief Complaint  Patient presents with  . Cough  . COVID positive    Alexandra Hale is a 49 y.o. female.  She was diagnosed with Covid on January 22.  She has had cough sometimes productive of some clear sputum, intermittent fevers, headache body aches, malaise.  PCP put her on some benzonatate and some over-the-counter medications.  She has been experiencing more nausea.  She had a virtual visit with her doctor and they asked her to come here to get a chest x-ray.  No chest pain no abdominal pain no urinary symptoms.  No recent fevers.  The history is provided by the patient.  Cough Cough characteristics:  Productive Sputum characteristics:  Clear Severity:  Moderate Onset quality:  Gradual Timing:  Intermittent Progression:  Unchanged Chronicity:  New Smoker: no   Context: upper respiratory infection   Relieved by:  Nothing Worsened by:  Activity Ineffective treatments:  Cough suppressants Associated symptoms: fever, headaches, myalgias, rhinorrhea and shortness of breath   Associated symptoms: no chest pain, no chills, no ear pain, no rash and no sore throat        Past Medical History:  Diagnosis Date  . Anxiety   . Depression   . Endometrial polyp   . History of abnormal cervical Pap smear   . History of cardiac murmur as a child   . History of exposure to tuberculosis    per pt father had TB 1989 and she was treated for exposure with medication for 6 months  . Hypertension   . IDA (iron deficiency anemia)   . Pre-diabetes   . Uterine fibroid   . Wears contact lenses     Patient Active Problem List   Diagnosis Date Noted  . Hypertension 05/28/2015  . Prediabetes 05/28/2015  . PP care - s/p NVB 5/18 01/05/2012    Past Surgical History:  Procedure Laterality Date  . CERVICAL BIOPSY  W/ LOOP ELECTRODE EXCISION  1995  . COMBINED  HYSTEROSCOPY DIAGNOSTIC / D&C  06/28/1999  . DILATATION & CURETTAGE/HYSTEROSCOPY WITH MYOSURE N/A 04/13/2019   Procedure: DILATATION & CURETTAGE/HYSTEROSCOPY WITH MYOSURE;  Surgeon: Charyl Bigger, MD;  Location: [facility];  Service: Gynecology;  Laterality: N/A;  . LAPAROSCOPIC TUBAL LIGATION Bilateral 05/20/2014   Procedure: LAPAROSCOPIC TUBAL LIGATION ;  Surgeon: Princess Bruins, MD;  Location: [facility];  Service: Gynecology;  Laterality: Bilateral;  . RETINAL DETACHMENT SURGERY  2016  appprox.   unilateral  . WISDOM TOOTH EXTRACTION  1997     OB History    Gravida  3   Para  3   Term  3   Preterm      AB      Living  3     SAB      TAB      Ectopic      Multiple      Live Births  1           Family History  Problem Relation Age of Onset  . Hypertension Mother   . Deep vein thrombosis Mother   . Cancer Father   . Heart disease Maternal Grandmother   . Anesthesia problems Neg Hx     Social History   Tobacco Use  . Smoking status: Never Smoker  . Smokeless tobacco: Never Used  Substance Use Topics  . Alcohol use:  No  . Drug use: No    Home Medications Prior to Admission medications   Medication Sig Start Date End Date Taking? Authorizing Provider  buPROPion (WELLBUTRIN SR) 150 MG 12 hr tablet Take 150 mg by mouth 2 (two) times daily.    [provider]  buPROPion (WELLBUTRIN XL) 150 MG 24 hr tablet Take 150 mg by mouth daily.    [provider]  ferrous sulfate 325 (65 FE) MG tablet Take 325 mg by mouth daily with breakfast. OTC    [provider]  hydrochlorothiazide (HYDRODIURIL) 25 MG tablet Take 25 mg by mouth daily.    [provider]  ibuprofen (ADVIL) 800 MG tablet Take 1 tablet (800 mg total) by mouth every 8 (eight) hours as needed. 04/13/19   Charyl Bigger, MD  liraglutide (VICTOZA) 18 MG/3ML SOPN Inject 1.8 mg into the skin daily.    [provider]  losartan (COZAAR) 50 MG  tablet Take 50 mg by mouth daily.    [provider]  sertraline (ZOLOFT) 25 MG tablet Take 25 mg by mouth daily.    [provider]    Allergies    Patient has no known allergies.  Review of Systems   Review of Systems  Constitutional: Positive for fever. Negative for chills.  HENT: Positive for rhinorrhea. Negative for ear pain and sore throat.   Eyes: Negative for visual disturbance.  Respiratory: Positive for cough and shortness of breath.   Cardiovascular: Negative for chest pain.  Gastrointestinal: Negative for abdominal pain.  Genitourinary: Negative for dysuria.  Musculoskeletal: Positive for myalgias.  Skin: Negative for rash.  Neurological: Positive for headaches.    Physical Exam Updated Vital Signs BP (!) 156/97 (BP Location: Left Arm)   Pulse 77   Temp 98.7 F (37.1 C)   Resp 16   SpO2 94%   Physical Exam Vitals and nursing note reviewed.  Constitutional:      General: She is not in acute distress.    Appearance: She is well-developed.  HENT:     Head: Normocephalic and atraumatic.  Eyes:     Conjunctiva/sclera: Conjunctivae normal.  Cardiovascular:     Rate and Rhythm: Normal rate and regular rhythm.     Heart sounds: No murmur.  Pulmonary:     Effort: Pulmonary effort is normal. No respiratory distress.     Breath sounds: Normal breath sounds.  Abdominal:     Palpations: Abdomen is soft.     Tenderness: There is no abdominal tenderness.  Musculoskeletal:        General: Normal range of motion.     Cervical back: Neck supple.     Right lower leg: No edema.     Left lower leg: No edema.  Skin:    General: Skin is warm and dry.     Capillary Refill: Capillary refill takes less than 2 seconds.  Neurological:     General: No focal deficit present.     Mental Status: She is alert.     ED Results / Procedures / Treatments   Labs (all labs ordered are listed, but only abnormal results are displayed) Labs Reviewed - No data to  display  EKG None  Radiology DG Chest [facility] 1 View  Result Date: 09/22/2019 CLINICAL DATA:  49 year old female with cough. EXAM: PORTABLE CHEST 1 VIEW COMPARISON:  Chest radiograph dated 01/04/2006. FINDINGS: The heart size and mediastinal contours are within normal limits. Both lungs are clear. The visualized skeletal structures  are unremarkable. IMPRESSION: No active disease. Electronically Signed   By: Anner Crete M.D.   On: 09/22/2019 18:08    Procedures Procedures (including critical care time)  Medications Ordered in ED Medications  ondansetron (ZOFRAN-ODT) disintegrating tablet 8 mg (8 mg Oral Given 09/22/19 1844)    ED Course  I have reviewed the triage vital signs and the nursing notes.  Pertinent labs & imaging results that were available during my care of the patient were reviewed by me and considered in my medical decision making (see chart for details).  Clinical Course as of Sep 22 13  Wed Sep 22, 3351  [id number] 49 year old female Covid positive here with continued cough, fatigue, and now new nausea.  Nontoxic-appearing.  Sats 94 to 97% on room air.  Not tachypneic.  Clear sensorium.  Differential includes Covid, medication reaction, dehydration, pneumonia   [MB]  1746 Chest x-ray interpreted by me as no gross infiltrates.  Awaiting radiology reading.   [MB]  [id number]   Chest x-ray interpreted by me as no gross infiltrates.  Awaiting radiology reading.   [MB]    Clinical Course User Index [MB] Hayden Rasmussen, MD   MDM Rules/Calculators/A&P                     Ramya Lingg was evaluated in Emergency Department on 09/22/2019 for the symptoms described in the history of present illness. She was evaluated in the context of the global COVID-19 pandemic, which necessitated consideration that the patient might be at risk for infection with the SARS-CoV-2 virus that causes COVID-19. Institutional protocols and algorithms that pertain to the evaluation of patients at risk for  COVID-19 are in a state of rapid change based on information released by regulatory bodies including the CDC and federal and state organizations. These policies and algorithms were followed during the patient's care in the ED.   Final Clinical Impression(s) / ED Diagnoses Final diagnoses:  COVID-19 virus infection    Rx / DC Orders ED Discharge Orders    None       Hayden Rasmussen, MD 09/23/19 [phone]

## 2019-09-22 NOTE — Discharge Instructions (Signed)
You were seen in the emergency department for continued cough and nausea in the setting of a Covid infection.  Your oxygen number was normal your chest x-ray did not show any signs of pneumonia.  We are prescribing you a medicated cough medicine and some nausea medication.  Please contact your primary care doctor for close follow-up.  Return to the emergency department if any worsening symptoms.

## 2019-09-22 NOTE — ED Triage Notes (Signed)
Pt reports she tested positive on the 22nd of January. Patient reports cough, malaise, and nausea. Patient's pcp wants patient to get checked to rule out pneumonia.

## 2020-06-12 ENCOUNTER — Other Ambulatory Visit: Payer: Self-pay | Admitting: Obstetrics and Gynecology

## 2020-06-20 ENCOUNTER — Other Ambulatory Visit: Payer: Self-pay

## 2020-06-20 ENCOUNTER — Encounter (HOSPITAL_BASED_OUTPATIENT_CLINIC_OR_DEPARTMENT_OTHER): Payer: Self-pay | Admitting: Obstetrics and Gynecology

## 2020-06-20 NOTE — Progress Notes (Signed)
Spoke w/ via phone for pre-op interview---pt Lab needs dos----   none            Lab results------has lab appt 06-27-2020 1300 pm for cbc, bmet, serum preg, ekg COVID test ------11-0-2021 1420 Arrive at -------730 am 06-30-2020 NPO after MN NO Solid Food.  Clear liquids from MN until---630 am then npo Medications to take morning of surgery -----bring inhaler Diabetic medication -----none day of surgery Patient Special Instructions -----none Pre-Op special Istructions -----none Patient verbalized understanding of instructions that were given at this phone interview. Patient denies shortness of breath, chest pain, fever, cough at this phone interview.

## 2020-06-27 ENCOUNTER — Encounter (HOSPITAL_COMMUNITY)
Admission: RE | Admit: 2020-06-27 | Discharge: 2020-06-27 | Disposition: A | Discharge status: Home / Self Care | Payer: 59 | Source: Ambulatory Visit | Attending: Obstetrics and Gynecology | Admitting: Obstetrics and Gynecology

## 2020-06-27 ENCOUNTER — Other Ambulatory Visit (HOSPITAL_COMMUNITY)
Admission: RE | Admit: 2020-06-27 | Discharge: 2020-06-27 | Disposition: A | Discharge status: Home / Self Care | Payer: 59 | Source: Ambulatory Visit | Attending: Obstetrics and Gynecology | Admitting: Obstetrics and Gynecology

## 2020-06-27 ENCOUNTER — Other Ambulatory Visit: Payer: Self-pay

## 2020-06-27 DIAGNOSIS — Z01818 Encounter for other preprocedural examination: Secondary | ICD-10-CM | POA: Insufficient documentation

## 2020-06-27 DIAGNOSIS — Z20822 Contact with and (suspected) exposure to covid-19: Secondary | ICD-10-CM | POA: Diagnosis not present

## 2020-06-27 DIAGNOSIS — I10 Essential (primary) hypertension: Secondary | ICD-10-CM | POA: Insufficient documentation

## 2020-06-27 LAB — BASIC METABOLIC PANEL
Anion gap: 6 (ref 5–15)
BUN: 16 mg/dL (ref 6–20)
CO2: 28 mmol/L (ref 22–32)
Calcium: 9.6 mg/dL (ref 8.9–10.3)
Chloride: 105 mmol/L (ref 98–111)
Creatinine, Ser: 0.74 mg/dL (ref 0.44–1.00)
GFR, Estimated: >60 mL/min (ref >60)
Glucose, Bld: 74 mg/dL (ref 70–99)
Potassium: 4.1 mmol/L (ref 3.5–5.1)
Sodium: 139 mmol/L (ref 135–145)

## 2020-06-27 LAB — SARS CORONAVIRUS 2 (TAT 6-24 HRS): SARS Coronavirus 2: NEGATIVE

## 2020-06-27 LAB — CBC
HCT: 41.7 % (ref 36.0–46.0)
Hemoglobin: 13 g/dL (ref 12.0–15.0)
MCH: 27.5 pg (ref 26.0–34.0)
MCHC: 31.2 g/dL (ref 30.0–36.0)
MCV: 88.3 fL (ref 80.0–100.0)
Platelets: 293 10*3/uL (ref 150–400)
RBC: 4.72 MIL/uL (ref 3.87–5.11)
RDW: 14.2 % (ref 11.5–15.5)
WBC: 8.4 10*3/uL (ref 4.0–10.5)
nRBC: 0 % (ref 0.0–0.2)

## 2020-06-27 LAB — HCG, SERUM, QUALITATIVE: Preg, Serum: NEGATIVE

## 2020-06-30 ENCOUNTER — Ambulatory Visit (HOSPITAL_BASED_OUTPATIENT_CLINIC_OR_DEPARTMENT_OTHER): Payer: 59 | Admitting: Certified Registered Nurse Anesthetist

## 2020-06-30 ENCOUNTER — Ambulatory Visit (HOSPITAL_BASED_OUTPATIENT_CLINIC_OR_DEPARTMENT_OTHER)
Admission: RE | Admit: 2020-06-30 | Discharge: 2020-06-30 | Disposition: A | Discharge status: Home / Self Care | Payer: 59 | Attending: Obstetrics and Gynecology | Admitting: Obstetrics and Gynecology

## 2020-06-30 ENCOUNTER — Encounter (HOSPITAL_BASED_OUTPATIENT_CLINIC_OR_DEPARTMENT_OTHER)
Admission: RE | Disposition: A | Discharge status: Home / Self Care | Payer: Self-pay | Source: Home / Self Care | Attending: Obstetrics and Gynecology

## 2020-06-30 ENCOUNTER — Encounter (HOSPITAL_BASED_OUTPATIENT_CLINIC_OR_DEPARTMENT_OTHER): Payer: Self-pay | Admitting: Obstetrics and Gynecology

## 2020-06-30 ENCOUNTER — Other Ambulatory Visit: Payer: Self-pay

## 2020-06-30 DIAGNOSIS — N840 Polyp of corpus uteri: Secondary | ICD-10-CM | POA: Insufficient documentation

## 2020-06-30 DIAGNOSIS — E119 Type 2 diabetes mellitus without complications: Secondary | ICD-10-CM | POA: Insufficient documentation

## 2020-06-30 DIAGNOSIS — Z7951 Long term (current) use of inhaled steroids: Secondary | ICD-10-CM | POA: Insufficient documentation

## 2020-06-30 DIAGNOSIS — R9389 Abnormal findings on diagnostic imaging of other specified body structures: Secondary | ICD-10-CM | POA: Insufficient documentation

## 2020-06-30 DIAGNOSIS — K219 Gastro-esophageal reflux disease without esophagitis: Secondary | ICD-10-CM | POA: Insufficient documentation

## 2020-06-30 DIAGNOSIS — Z79899 Other long term (current) drug therapy: Secondary | ICD-10-CM | POA: Diagnosis not present

## 2020-06-30 DIAGNOSIS — Z809 Family history of malignant neoplasm, unspecified: Secondary | ICD-10-CM | POA: Insufficient documentation

## 2020-06-30 DIAGNOSIS — J45909 Unspecified asthma, uncomplicated: Secondary | ICD-10-CM | POA: Insufficient documentation

## 2020-06-30 DIAGNOSIS — Z8249 Family history of ischemic heart disease and other diseases of the circulatory system: Secondary | ICD-10-CM | POA: Insufficient documentation

## 2020-06-30 DIAGNOSIS — Z7984 Long term (current) use of oral hypoglycemic drugs: Secondary | ICD-10-CM | POA: Diagnosis not present

## 2020-06-30 DIAGNOSIS — Z8616 Personal history of COVID-19: Secondary | ICD-10-CM | POA: Diagnosis not present

## 2020-06-30 DIAGNOSIS — I10 Essential (primary) hypertension: Secondary | ICD-10-CM | POA: Insufficient documentation

## 2020-06-30 HISTORY — DX: Headache, unspecified: R51.9

## 2020-06-30 HISTORY — PX: DILATATION & CURETTAGE/HYSTEROSCOPY WITH MYOSURE: SHX6511

## 2020-06-30 HISTORY — DX: Unspecified asthma, uncomplicated: J45.909

## 2020-06-30 HISTORY — DX: COVID-19: U07.1

## 2020-06-30 LAB — GLUCOSE, CAPILLARY: Glucose-Capillary: 82 mg/dL (ref 70–99)

## 2020-06-30 SURGERY — DILATATION & CURETTAGE/HYSTEROSCOPY WITH MYOSURE
Anesthesia: General

## 2020-06-30 MED ORDER — OXYCODONE HCL 5 MG PO TABS: 5.0000 mg | ORAL_TABLET | Freq: Once | ORAL | Status: DC | PRN

## 2020-06-30 MED ORDER — FENTANYL CITRATE (PF) 100 MCG/2ML IJ SOLN
INTRAMUSCULAR | Status: DC | PRN
  Administered 2020-06-30: 50 ug via INTRAVENOUS

## 2020-06-30 MED ORDER — POVIDONE-IODINE 10 % EX SWAB: 2.0000 "application " | Freq: Once | CUTANEOUS | Status: DC

## 2020-06-30 MED ORDER — FENTANYL CITRATE (PF) 100 MCG/2ML IJ SOLN: 25.0000 ug | INTRAMUSCULAR | Status: DC | PRN

## 2020-06-30 MED ORDER — MEPERIDINE HCL 25 MG/ML IJ SOLN: 6.2500 mg | INTRAMUSCULAR | Status: DC | PRN

## 2020-06-30 MED ORDER — SODIUM CHLORIDE 0.9 % IR SOLN
Status: DC | PRN
  Administered 2020-06-30: 3000 mL

## 2020-06-30 MED ORDER — ACETAMINOPHEN 160 MG/5ML PO SOLN: 325.0000 mg | Freq: Once | ORAL | Status: DC | PRN

## 2020-06-30 MED ORDER — DEXAMETHASONE SODIUM PHOSPHATE 10 MG/ML IJ SOLN
INTRAMUSCULAR | Status: DC | PRN
  Administered 2020-06-30: 10 mg via INTRAVENOUS

## 2020-06-30 MED ORDER — LIDOCAINE 2% (20 MG/ML) 5 ML SYRINGE
INTRAMUSCULAR | Status: AC
  Filled 2020-06-30: qty 5

## 2020-06-30 MED ORDER — OXYCODONE HCL 5 MG/5ML PO SOLN: 5.0000 mg | Freq: Once | ORAL | Status: DC | PRN

## 2020-06-30 MED ORDER — LACTATED RINGERS IV SOLN: INTRAVENOUS | Status: DC

## 2020-06-30 MED ORDER — PROPOFOL 500 MG/50ML IV EMUL
INTRAVENOUS | Status: DC | PRN
  Administered 2020-06-30: 200 mg via INTRAVENOUS

## 2020-06-30 MED ORDER — ACETAMINOPHEN 325 MG PO TABS: 325.0000 mg | ORAL_TABLET | Freq: Once | ORAL | Status: DC | PRN

## 2020-06-30 MED ORDER — FENTANYL CITRATE (PF) 100 MCG/2ML IJ SOLN
INTRAMUSCULAR | Status: AC
  Filled 2020-06-30: qty 2

## 2020-06-30 MED ORDER — MIDAZOLAM HCL 2 MG/2ML IJ SOLN
INTRAMUSCULAR | Status: AC
  Filled 2020-06-30: qty 2

## 2020-06-30 MED ORDER — PROPOFOL 10 MG/ML IV BOLUS
INTRAVENOUS | Status: AC
  Filled 2020-06-30: qty 20

## 2020-06-30 MED ORDER — AMISULPRIDE (ANTIEMETIC) 5 MG/2ML IV SOLN: 10.0000 mg | Freq: Once | INTRAVENOUS | Status: DC | PRN

## 2020-06-30 MED ORDER — ONDANSETRON HCL 4 MG/2ML IJ SOLN
INTRAMUSCULAR | Status: AC
  Filled 2020-06-30: qty 2

## 2020-06-30 MED ORDER — IBUPROFEN 800 MG PO TABS: 800.0000 mg | ORAL_TABLET | Freq: Three times a day (TID) | ORAL | 0 refills | Status: AC | PRN

## 2020-06-30 MED ORDER — ONDANSETRON HCL 4 MG/2ML IJ SOLN
INTRAMUSCULAR | Status: DC | PRN
  Administered 2020-06-30: 4 mg via INTRAVENOUS

## 2020-06-30 MED ORDER — ACETAMINOPHEN 10 MG/ML IV SOLN: 1000.0000 mg | Freq: Once | INTRAVENOUS | Status: DC | PRN

## 2020-06-30 MED ORDER — DEXAMETHASONE SODIUM PHOSPHATE 10 MG/ML IJ SOLN
INTRAMUSCULAR | Status: AC
  Filled 2020-06-30: qty 1

## 2020-06-30 MED ORDER — SCOPOLAMINE 1 MG/3DAYS TD PT72: 1.0000 | MEDICATED_PATCH | TRANSDERMAL | Status: DC

## 2020-06-30 MED ORDER — MIDAZOLAM HCL 2 MG/2ML IJ SOLN
INTRAMUSCULAR | Status: DC | PRN
  Administered 2020-06-30: 2 mg via INTRAVENOUS

## 2020-06-30 SURGICAL SUPPLY — 19 items
CATH ROBINSON RED A/P 16FR (CATHETERS) ×2 IMPLANT
DEVICE MYOSURE LITE (MISCELLANEOUS) IMPLANT
DEVICE MYOSURE REACH (MISCELLANEOUS) ×1 IMPLANT
DRAPE SHEET LG 3/4 BI-LAMINATE (DRAPES) ×1 IMPLANT
GLOVE BIOGEL PI IND STRL 6.5 (GLOVE) ×1 IMPLANT
GLOVE BIOGEL PI INDICATOR 6.5 (GLOVE) ×1
GLOVE ECLIPSE 6.0 STRL STRAW (GLOVE) ×2 IMPLANT
GOWN STRL REUS W/TWL LRG LVL3 (GOWN DISPOSABLE) ×4 IMPLANT
HIBICLENS CHG 4% 4OZ (MISCELLANEOUS) IMPLANT
IV NS IRRIG 3000ML ARTHROMATIC (IV SOLUTION) ×2 IMPLANT
KIT PROCEDURE FLUENT (KITS) ×2 IMPLANT
LEGGING LITHOTOMY PAIR STRL (DRAPES) ×1 IMPLANT
MYOSURE XL FIBROID REM (MISCELLANEOUS)
PACK VAGINAL MINOR WOMEN LF (CUSTOM PROCEDURE TRAY) ×2 IMPLANT
PAD OB MATERNITY 4.3X12.25 (PERSONAL CARE ITEMS) ×2 IMPLANT
SEAL CERVICAL OMNI LOK (ABLATOR) IMPLANT
SEAL ROD LENS SCOPE MYOSURE (ABLATOR) ×2 IMPLANT
SYSTEM TISS REMOVAL MYSR XL RM (MISCELLANEOUS) IMPLANT
TOWEL OR 17X26 10 PK STRL BLUE (TOWEL DISPOSABLE) ×2 IMPLANT

## 2020-06-30 NOTE — Discharge Instructions (Signed)

## 2020-06-30 NOTE — Op Note (Signed)
Preoperative diagnosis: thickened endometrium, possible endometrial polyp  Postop diagnosis: as above.  Procedure: Hysteroscopic polypectomy, D&C Anesthesia General via LMA  Surgeon: Tiana Loft, MD  Assistant:none IV fluids : 867ml Estimated blood loss : 75ml Urine output: straight catheter preop  : 606VP Complications none  Condition stable  Disposition PACU  Specimen: endometrial polyp with endometrial curettings   Procedure  Indication: thickened endometrium, suspicious for polyp.  Patient was counseled on risks/ complications including infection, bleeding, damage to internal organs, she understood and agrees, gave informed written consent.  Patient was brought to the operating room with IV running. Time out was carried out. She underwent general anesthesia via LMA without complications. She was given dorsolithotomy position. The patient was prepped and draped in standard fashion. Bladder was catheterized once. Bimanual exam revealed uterus to be anteverted and normal size. Speculum was placed and cervix was grasped with single-tooth tenaculum.  The uterus was sounded to 8 cm. Cervical os was dilated to 21 [place] dilator. Hysteroscope was introduced in the uterine cavity under vision. Findings: poypoid appearance of posterior cavity, normal appearing cavity near fundus. The myosure was then advanced into the uterus and polypectomy was performed along with curettings until cavity appeared wnl. Specimen sent to path. Hysteroscope was removed. Endometrial curettage was performed with sharp curette. All tissue sent to path. Picures taken Fluid deficit estimate 258ml. All counts are correct x2. No complications. Patient was made supine dorsal anesthesia and brought to the recovery room in stable condition.  Patient will be discharged home today. Discharge meds ibuprofen. Follow up in 2 weeks in office. Warning signs of infection and excessive bleeding reviewed.

## 2020-06-30 NOTE — Transfer of Care (Signed)
Immediate Anesthesia Transfer of Care Note  Patient: Alexandra Hale  Procedure(s) Performed: Procedure(s): DILATATION & CURETTAGE/HYSTEROSCOPY WITH MYOSURE WITH POLYPECTOMY (N/A)  Patient Location: PACU  Anesthesia Type:General  Level of Consciousness: Alert, Awake, Oriented  Airway & Oxygen Therapy: Patient Spontanous Breathing  Post-op Assessment: Report given to RN  Post vital signs: Reviewed and stable  Last Vitals:  Vitals:   06/30/20 0741  BP: 129/85  Pulse: 76  Resp: 16  Temp: 36.6 C  SpO2: 01%    Complications: No apparent anesthesia complications

## 2020-06-30 NOTE — Anesthesia Procedure Notes (Signed)
Procedure Name: LMA Insertion Date/Time: 06/30/2020 9:53 AM Performed by: Gerald Leitz, CRNA Pre-anesthesia Checklist: Patient identified, Patient being monitored, Timeout performed, Emergency Drugs available and Suction available Patient Re-evaluated:Patient Re-evaluated prior to induction Oxygen Delivery Method: Circle system utilized Preoxygenation: Pre-oxygenation with 100% oxygen Induction Type: IV induction Ventilation: Mask ventilation without difficulty LMA: LMA inserted LMA Size: 4.0 Tube type: Oral Number of attempts: 1 Placement Confirmation: positive ETCO2 and breath sounds checked- equal and bilateral Tube secured with: Tape Dental Injury: Teeth and Oropharynx as per pre-operative assessment

## 2020-06-30 NOTE — Anesthesia Postprocedure Evaluation (Signed)
Anesthesia Post Note  Patient: Hether Anselmo  Procedure(s) Performed: DILATATION & CURETTAGE/HYSTEROSCOPY WITH MYOSURE WITH POLYPECTOMY (N/A )     Patient location during evaluation: PACU Anesthesia Type: General Level of consciousness: awake and alert Pain management: pain level controlled Vital Signs Assessment: post-procedure vital signs reviewed and stable Respiratory status: spontaneous breathing, nonlabored ventilation, respiratory function stable and patient connected to nasal cannula oxygen Cardiovascular status: blood pressure returned to baseline and stable Postop Assessment: no apparent nausea or vomiting Anesthetic complications: no   No complications documented.  Last Vitals:  Vitals:   06/30/20 1115 06/30/20 1155  BP: 129/90 (!) 129/98  Pulse: 72 81  Resp: 14 14  Temp:  36.6 C  SpO2: 98% 98%    Last Pain:  Vitals:   06/30/20 1155  TempSrc: Axillary  PainSc: 0-No pain                 Effie Berkshire

## 2020-06-30 NOTE — H&P (Signed)
Alexandra Hale is an 49 y.o. female G2P2 here for D&C, hysteroscopy.  H/o passing large tissue at home then thickened endometrium noted on sis, no clear polyp noted.  Pertinent Gynecological History:    Menstrual History:  Patient's last menstrual period was 05/20/2020.    Past Medical History:  Diagnosis Date  . Asthma    exercise induced asthma  . COVID 01/ 22/2021   h/a fever, sob symptoms resolved in 12 days  . Endometrial polyp   . GERD (gastroesophageal reflux disease)   . Headache   . History of abnormal cervical Pap smear   . History of cardiac murmur as a child   . History of exposure to tuberculosis    per pt father had TB 1989 and she was treated for exposure with medication for 6 months  . Hypertension   . IDA (iron deficiency anemia)   . Pre-diabetes   . Uterine fibroid   . Wears contact lenses     Past Surgical History:  Procedure Laterality Date  . CERVICAL BIOPSY  W/ LOOP ELECTRODE EXCISION  1995  . COMBINED ABDOMINOPLASTY AND LIPOSUCTION    . COMBINED HYSTEROSCOPY DIAGNOSTIC / D&C  06/28/1999  . DILATATION & CURETTAGE/HYSTEROSCOPY WITH MYOSURE N/A 04/13/2019   Procedure: DILATATION & CURETTAGE/HYSTEROSCOPY WITH MYOSURE;  Surgeon: Charyl Bigger, MD;  Location: [facility];  Service: Gynecology;  Laterality: N/A;  . LAPAROSCOPIC TUBAL LIGATION Bilateral 05/20/2014   Procedure: LAPAROSCOPIC TUBAL LIGATION ;  Surgeon: Princess Bruins, MD;  Location: [facility];  Service: Gynecology;  Laterality: Bilateral;  . RETINAL DETACHMENT SURGERY Right 2016  appprox.  . WISDOM TOOTH EXTRACTION  1997    Family History  Problem Relation Age of Onset  . Hypertension Mother   . Deep vein thrombosis Mother   . Cancer Father   . Heart disease Maternal Grandmother   . Anesthesia problems Neg Hx     Social History:  reports that she has never smoked. She has never used smokeless tobacco. She reports that she does not drink alcohol and does not use  drugs.  Allergies: No Known Allergies  Medications Prior to Admission  Medication Sig Dispense Refill Last Dose  . albuterol (VENTOLIN HFA) 108 (90 Base) MCG/ACT inhaler Inhale 1 puff into the lungs every 6 (six) hours as needed for wheezing or shortness of breath.   Past Month at Unknown time  . ferrous sulfate 325 (65 FE) MG tablet Take 325 mg by mouth daily with breakfast. OTC   Past Week at Unknown time  . losartan-hydrochlorothiazide (HYZAAR) 100-25 MG tablet Take 1 tablet by mouth daily.   06/29/2020 at Unknown time  . valACYclovir (VALTREX) 1000 MG tablet Take 1,000 mg by mouth as needed.   Past Week at Unknown time  . liraglutide (VICTOZA) 18 MG/3ML SOPN Inject 1.2 mg into the skin daily.    06/28/2020    ROS SOB/chest pain/ HA/ vision changes/ LE pain or swelling/ etc.  Blood pressure 129/85, pulse 76, temperature 97.9 F (36.6 C), temperature source Oral, resp. rate 16, height 5\' 3"  (1.6 m), weight 82.1 kg, last menstrual period 05/20/2020, SpO2 98 %, unknown if currently breastfeeding. Physical Exam A&O x 3 HEENT: grossly wnl  Lungs : ctab CV : rrr Abdo : soft, nt, nd Extr : no edema, nt bilat Pelvic : deferred   Results for orders placed or performed during the hospital encounter of 06/30/20 (from the past 24 hour(s))  Glucose, capillary     Status: None  Collection Time: 06/30/20  8:14 AM  Result Value Ref Range   Glucose-Capillary 82 70 - 99 mg/dL  labs reviewed and wnl  No results found.  Assessment/Plan: 49 y/o G2P2 with h/o thickened endometrium. 1. Proceed to OR for diagnostic D&C, hysteroscopy, possible polypectomy.  Consent signed, pt has no questions.  Charyl Bigger 06/30/2020, 9:33 AM

## 2020-06-30 NOTE — Anesthesia Preprocedure Evaluation (Addendum)
Anesthesia Evaluation  Patient identified by MRN, date of birth, ID band Patient awake    Reviewed: Allergy & Precautions, NPO status , Patient's Chart, lab work & pertinent test results  Airway Mallampati: II  TM Distance: >3 FB Neck ROM: Full    Dental  (+) Teeth Intact, Dental Advisory Given   Pulmonary asthma ,    breath sounds clear to auscultation       Cardiovascular hypertension, Pt. on medications  Rhythm:Regular Rate:Normal     Neuro/Psych  Headaches, negative psych ROS   GI/Hepatic Neg liver ROS, GERD  ,  Endo/Other  diabetes, Type 2, Oral Hypoglycemic Agents  Renal/GU negative Renal ROS     Musculoskeletal negative musculoskeletal ROS (+)   Abdominal Normal abdominal exam  (+)   Peds  Hematology negative hematology ROS (+)   Anesthesia Other Findings   Reproductive/Obstetrics                            Anesthesia Physical Anesthesia Plan  ASA: II  Anesthesia Plan: General   Post-op Pain Management:    Induction: Intravenous  PONV Risk Score and Plan: 4 or greater and Dexamethasone, Ondansetron, Midazolam and Scopolamine patch - Pre-op  Airway Management Planned: LMA  Additional Equipment: None  Intra-op Plan:   Post-operative Plan: Extubation in OR  Informed Consent: I have reviewed the patients History and Physical, chart, labs and discussed the procedure including the risks, benefits and alternatives for the proposed anesthesia with the patient or authorized representative who has indicated his/her understanding and acceptance.     Dental advisory given  Plan Discussed with: CRNA  Anesthesia Plan Comments:        Anesthesia Quick Evaluation

## 2020-07-03 LAB — SURGICAL PATHOLOGY

## 2020-07-04 ENCOUNTER — Encounter (HOSPITAL_BASED_OUTPATIENT_CLINIC_OR_DEPARTMENT_OTHER): Payer: Self-pay | Admitting: Obstetrics and Gynecology

## 2020-08-04 IMAGING — DX DG CHEST 1V PORT
2 series · 2 of 2 positions shown · non-contrast
Comparison: Chest radiograph dated 01/04/2006.

CLINICAL DATA: 48-year-old female with cough.

EXAM:
PORTABLE CHEST 1 VIEW

[chest ap (1 of 2)]
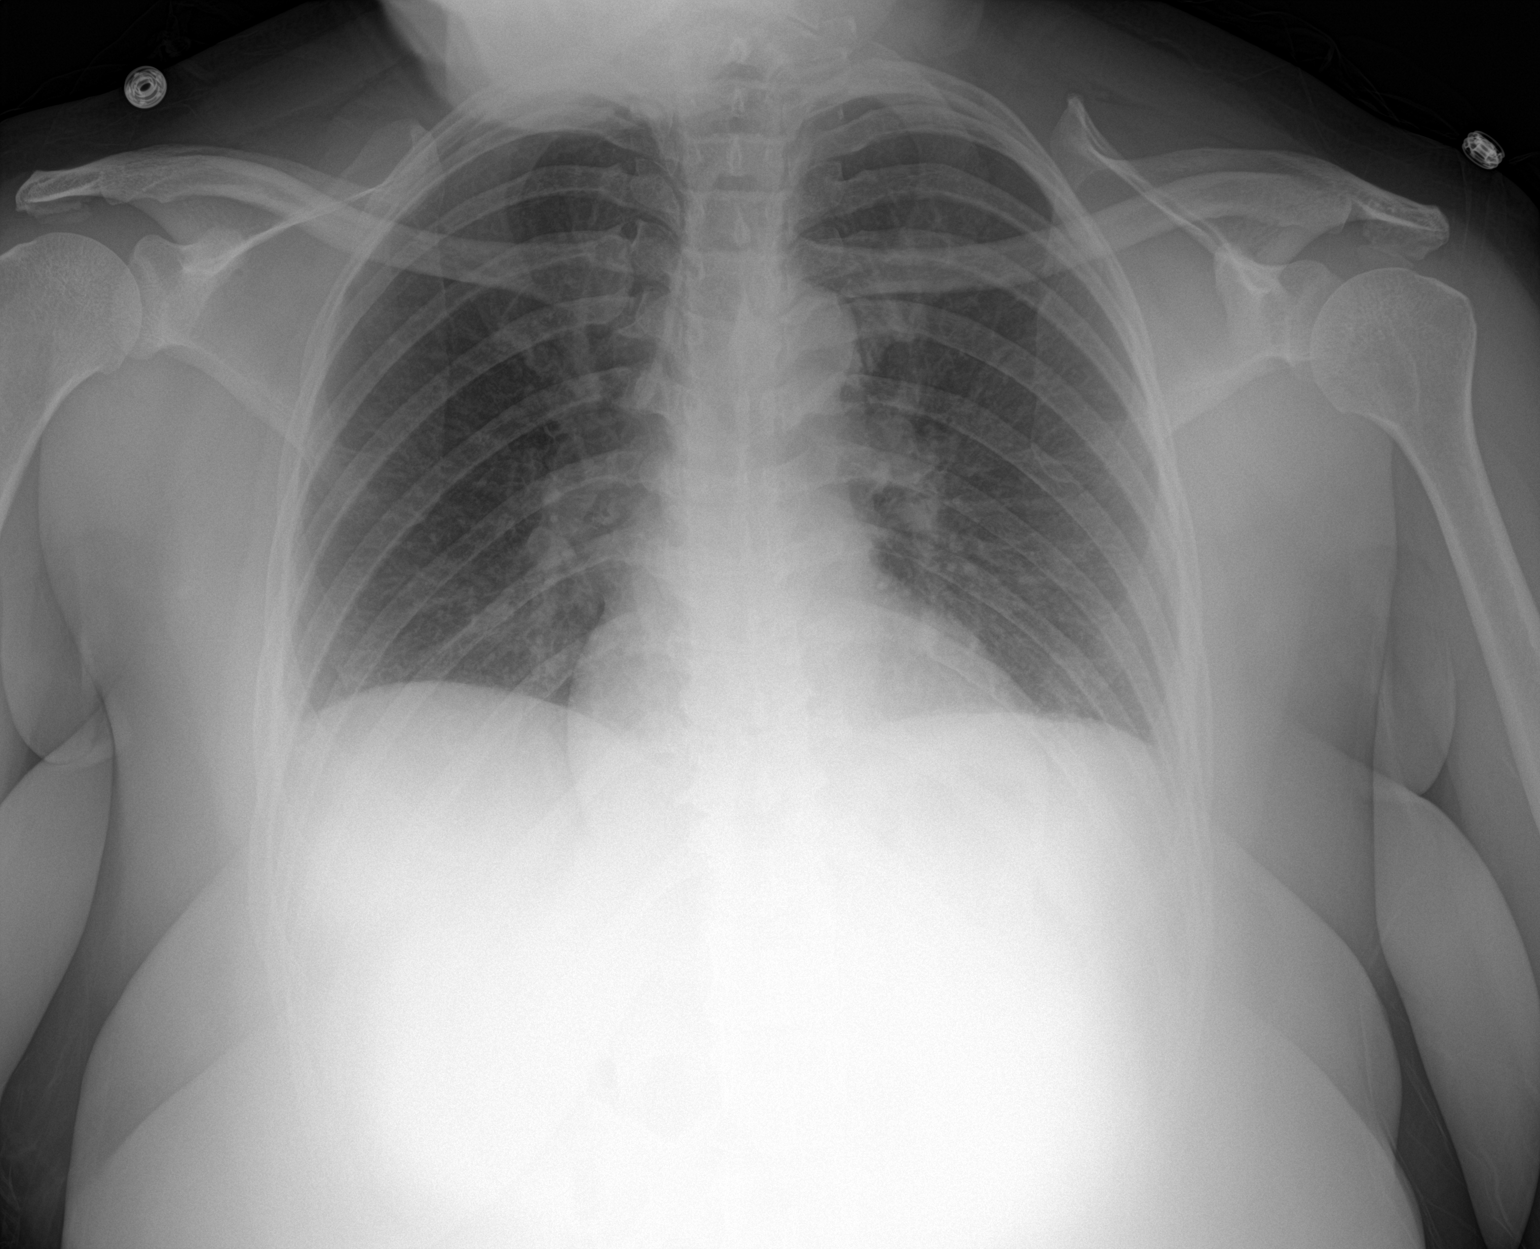

[chest ap (2 of 2)]
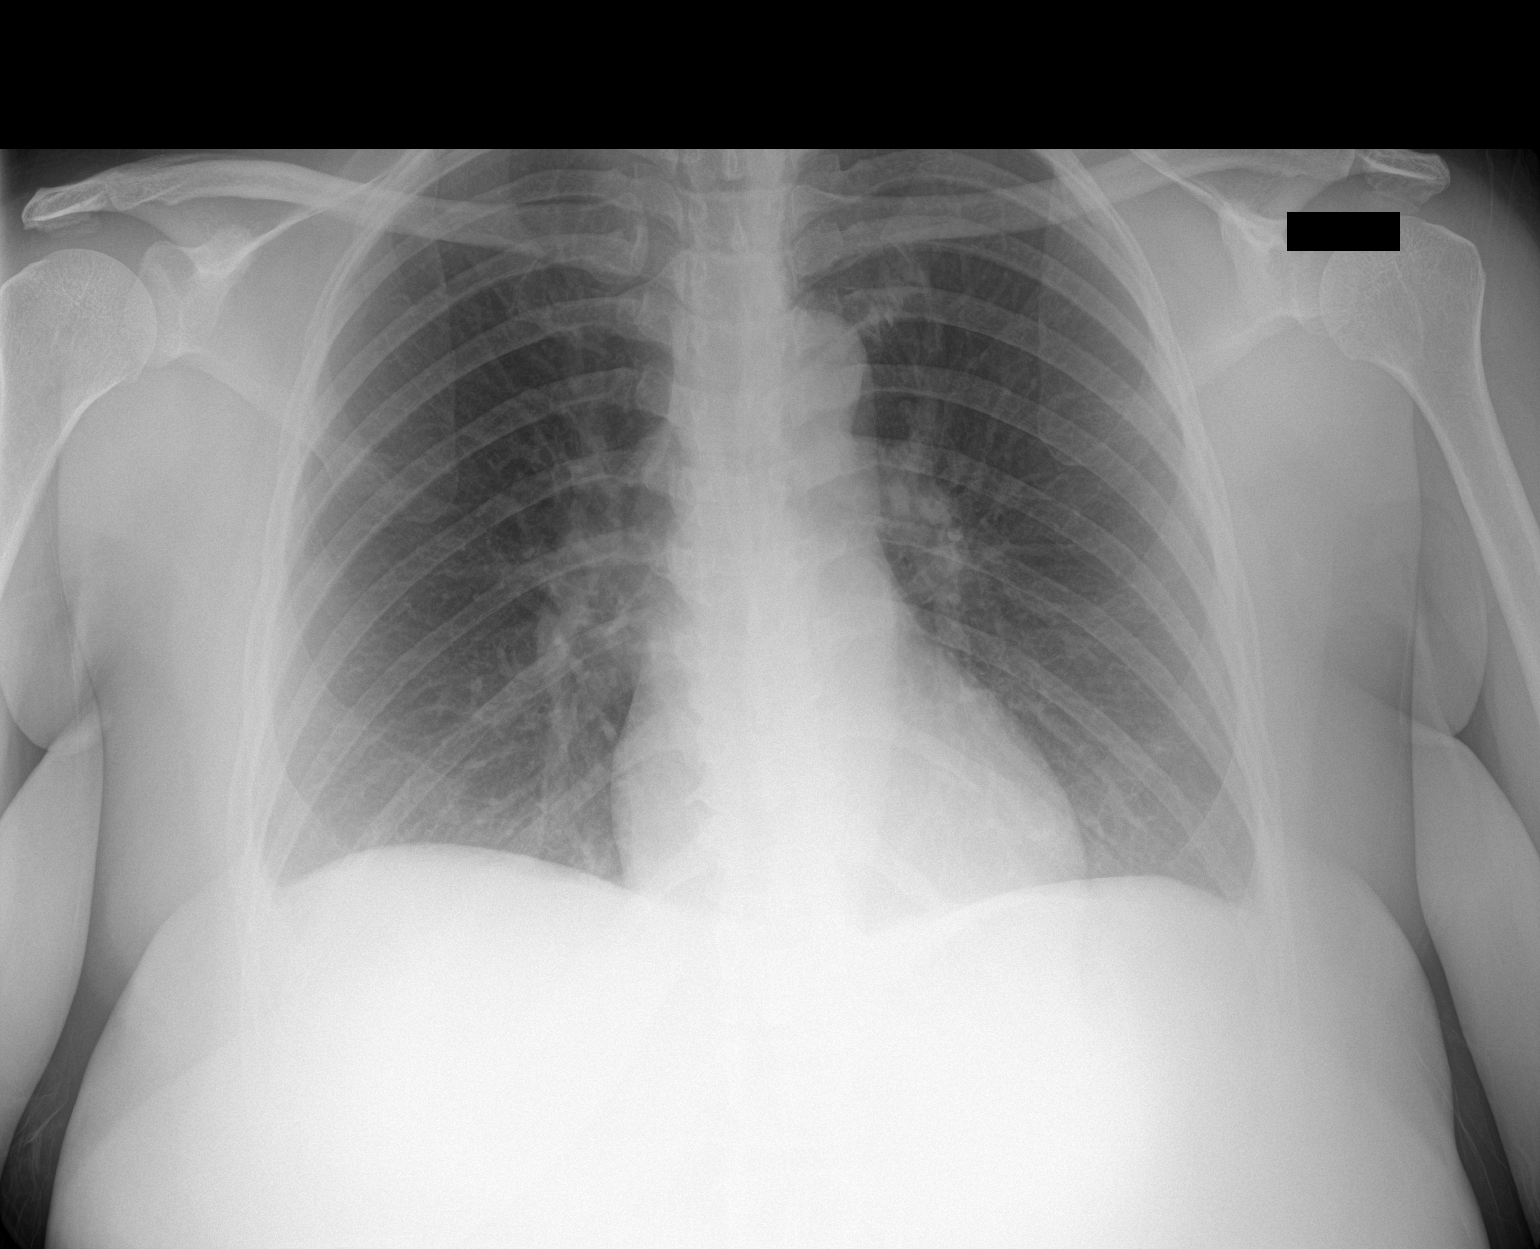

[2 of 2 positions shown; findings below may reference images not displayed]

FINDINGS: The heart size and mediastinal contours are within normal limits.
Both lungs are clear. The visualized skeletal structures are
unremarkable.
IMPRESSION: No active disease.

## 2020-11-17 ENCOUNTER — Ambulatory Visit
Admission: RE | Admit: 2020-11-17 | Discharge: 2020-11-17 | Disposition: A | Discharge status: Home / Self Care | Payer: 59 | Source: Ambulatory Visit | Attending: Internal Medicine | Admitting: Internal Medicine

## 2020-11-17 ENCOUNTER — Other Ambulatory Visit: Payer: Self-pay | Admitting: Internal Medicine

## 2020-11-17 DIAGNOSIS — J45990 Exercise induced bronchospasm: Secondary | ICD-10-CM

## 2022-07-30 ENCOUNTER — Other Ambulatory Visit (HOSPITAL_COMMUNITY): Payer: Self-pay

## 2022-07-30 MED ORDER — OZEMPIC (1 MG/DOSE) 4 MG/3ML ~~LOC~~ SOPN
1.0000 mg | PEN_INJECTOR | SUBCUTANEOUS | 2 refills | Status: AC
  Filled 2022-07-30: qty 3, 28d supply, fill #0
  Filled 2022-09-22: qty 3, 28d supply, fill #1
  Filled 2022-10-18: qty 3, 28d supply, fill #2

## 2022-10-26 ENCOUNTER — Other Ambulatory Visit (HOSPITAL_COMMUNITY): Payer: Self-pay

## 2022-10-26 MED ORDER — OZEMPIC (2 MG/DOSE) 8 MG/3ML ~~LOC~~ SOPN
2.0000 mg | PEN_INJECTOR | SUBCUTANEOUS | 2 refills | Status: AC
  Filled 2022-10-26 – 2022-11-15 (×2): qty 3, 28d supply, fill #0
  Filled 2022-12-18: qty 3, 28d supply, fill #1
  Filled 2023-01-11: qty 3, 28d supply, fill #2

## 2022-10-30 ENCOUNTER — Other Ambulatory Visit (HOSPITAL_COMMUNITY): Payer: Self-pay

## 2022-11-15 ENCOUNTER — Other Ambulatory Visit (HOSPITAL_COMMUNITY): Payer: Self-pay

## 2022-12-03 ENCOUNTER — Other Ambulatory Visit (HOSPITAL_COMMUNITY): Payer: Self-pay

## 2022-12-03 MED ORDER — OZEMPIC (2 MG/DOSE) 8 MG/3ML ~~LOC~~ SOPN
2.0000 mg | PEN_INJECTOR | SUBCUTANEOUS | 2 refills | Status: AC
  Filled 2022-12-03 – 2023-02-08 (×2): qty 3, 28d supply, fill #0
  Filled 2023-03-07: qty 3, 28d supply, fill #1
  Filled 2023-04-11: qty 3, 28d supply, fill #2

## 2023-02-10 ENCOUNTER — Other Ambulatory Visit (HOSPITAL_COMMUNITY): Payer: Self-pay

## 2023-05-11 ENCOUNTER — Other Ambulatory Visit (HOSPITAL_COMMUNITY): Payer: Self-pay

## 2023-05-15 ENCOUNTER — Other Ambulatory Visit (HOSPITAL_COMMUNITY): Payer: Self-pay

## 2023-05-15 MED ORDER — OZEMPIC (2 MG/DOSE) 8 MG/3ML ~~LOC~~ SOPN
0.7500 mL | PEN_INJECTOR | SUBCUTANEOUS | 2 refills | Status: DC
  Filled 2023-05-15: qty 3, 28d supply, fill #0
  Filled 2023-06-08: qty 3, 28d supply, fill #1
  Filled 2023-07-05: qty 3, 28d supply, fill #2

## 2023-08-05 ENCOUNTER — Other Ambulatory Visit (HOSPITAL_COMMUNITY): Payer: Self-pay

## 2023-08-05 MED ORDER — OZEMPIC (2 MG/DOSE) 8 MG/3ML ~~LOC~~ SOPN
2.0000 mg | PEN_INJECTOR | SUBCUTANEOUS | 0 refills | Status: DC
  Filled 2023-08-05: qty 3, 28d supply, fill #0
  Filled 2023-09-01: qty 3, 28d supply, fill #1
  Filled 2023-09-28: qty 3, 28d supply, fill #2

## 2023-10-24 ENCOUNTER — Other Ambulatory Visit (HOSPITAL_COMMUNITY): Payer: Self-pay

## 2023-10-24 MED ORDER — SEMAGLUTIDE (2 MG/DOSE) 8 MG/3ML ~~LOC~~ SOPN
2.0000 mg | PEN_INJECTOR | SUBCUTANEOUS | 0 refills | Status: DC
  Filled 2023-10-24: qty 3, 28d supply, fill #0
  Filled 2023-11-21: qty 3, 28d supply, fill #1
  Filled 2023-12-21 – 2023-12-25 (×2): qty 3, 28d supply, fill #2

## 2023-12-22 ENCOUNTER — Other Ambulatory Visit: Payer: Self-pay

## 2023-12-23 ENCOUNTER — Other Ambulatory Visit (HOSPITAL_COMMUNITY): Payer: Self-pay

## 2023-12-23 ENCOUNTER — Other Ambulatory Visit: Payer: Self-pay

## 2023-12-24 ENCOUNTER — Other Ambulatory Visit (HOSPITAL_COMMUNITY): Payer: Self-pay

## 2023-12-25 ENCOUNTER — Other Ambulatory Visit (HOSPITAL_COMMUNITY): Payer: Self-pay

## 2024-01-17 ENCOUNTER — Other Ambulatory Visit (HOSPITAL_COMMUNITY): Payer: Self-pay

## 2024-01-19 ENCOUNTER — Other Ambulatory Visit (HOSPITAL_COMMUNITY): Payer: Self-pay

## 2024-01-19 MED ORDER — SEMAGLUTIDE (2 MG/DOSE) 8 MG/3ML ~~LOC~~ SOPN
2.0000 mg | PEN_INJECTOR | SUBCUTANEOUS | 0 refills | Status: DC
  Filled 2024-01-19: qty 3, 28d supply, fill #0
  Filled 2024-02-15: qty 3, 28d supply, fill #1
  Filled 2024-03-14: qty 3, 28d supply, fill #2

## 2024-04-13 ENCOUNTER — Other Ambulatory Visit (HOSPITAL_COMMUNITY): Payer: Self-pay

## 2024-04-13 MED ORDER — OZEMPIC (2 MG/DOSE) 8 MG/3ML ~~LOC~~ SOPN
2.0000 mg | PEN_INJECTOR | SUBCUTANEOUS | 0 refills | Status: DC
  Filled 2024-04-13: qty 3, 28d supply, fill #0
  Filled 2024-05-09: qty 3, 28d supply, fill #1
  Filled 2024-06-07: qty 3, 28d supply, fill #2

## 2024-05-11 ENCOUNTER — Other Ambulatory Visit (HOSPITAL_COMMUNITY): Payer: Self-pay

## 2024-07-03 ENCOUNTER — Other Ambulatory Visit (HOSPITAL_COMMUNITY): Payer: Self-pay

## 2024-07-05 MED ORDER — SEMAGLUTIDE (2 MG/DOSE) 8 MG/3ML ~~LOC~~ SOPN
2.0000 mg | PEN_INJECTOR | SUBCUTANEOUS | 0 refills | Status: AC
  Filled 2024-07-05: qty 3, 28d supply, fill #0
  Filled 2024-07-31: qty 3, 28d supply, fill #1
  Filled 2024-08-28: qty 3, 28d supply, fill #2

## 2024-07-06 ENCOUNTER — Other Ambulatory Visit (HOSPITAL_COMMUNITY): Payer: Self-pay

## 2024-08-23 ENCOUNTER — Other Ambulatory Visit: Payer: Self-pay
